# Patient Record
Sex: Female | Born: 1970 | Race: White | Hispanic: No | Marital: Married | Smoking: Never smoker
Health system: Southern US, Community
[De-identification: ages and names within clinical notes are randomized; demographics above are authoritative.]

## PROBLEM LIST (undated history)

## (undated) DIAGNOSIS — F329 Major depressive disorder, single episode, unspecified: Secondary | ICD-10-CM

## (undated) DIAGNOSIS — F419 Anxiety disorder, unspecified: Secondary | ICD-10-CM

## (undated) DIAGNOSIS — F0390 Unspecified dementia without behavioral disturbance: Secondary | ICD-10-CM

## (undated) DIAGNOSIS — E079 Disorder of thyroid, unspecified: Secondary | ICD-10-CM

## (undated) DIAGNOSIS — F319 Bipolar disorder, unspecified: Secondary | ICD-10-CM

## (undated) DIAGNOSIS — E039 Hypothyroidism, unspecified: Secondary | ICD-10-CM

## (undated) HISTORY — PX: WISDOM TOOTH EXTRACTION: SHX21

## (undated) HISTORY — PX: FOOT SURGERY: SHX648

## (undated) HISTORY — PX: CHOLECYSTECTOMY: SHX55

---

## 2015-12-25 ENCOUNTER — Emergency Department (HOSPITAL_COMMUNITY): Payer: BLUE CROSS/BLUE SHIELD

## 2015-12-25 ENCOUNTER — Encounter (HOSPITAL_COMMUNITY): Payer: Self-pay

## 2015-12-25 ENCOUNTER — Emergency Department (HOSPITAL_COMMUNITY)
Admission: EM | Admit: 2015-12-25 | Discharge: 2015-12-25 | Disposition: A | Discharge status: Home / Self Care | Payer: BLUE CROSS/BLUE SHIELD | Attending: Emergency Medicine | Admitting: Emergency Medicine

## 2015-12-25 DIAGNOSIS — K2970 Gastritis, unspecified, without bleeding: Secondary | ICD-10-CM | POA: Insufficient documentation

## 2015-12-25 DIAGNOSIS — R109 Unspecified abdominal pain: Secondary | ICD-10-CM | POA: Diagnosis present

## 2015-12-25 DIAGNOSIS — Z9049 Acquired absence of other specified parts of digestive tract: Secondary | ICD-10-CM | POA: Insufficient documentation

## 2015-12-25 HISTORY — DX: Disorder of thyroid, unspecified: E07.9

## 2015-12-25 LAB — URINALYSIS, ROUTINE W REFLEX MICROSCOPIC
Bilirubin Urine: NEGATIVE
GLUCOSE, UA: NEGATIVE mg/dL
Hgb urine dipstick: NEGATIVE
Ketones, ur: NEGATIVE mg/dL
NITRITE: NEGATIVE
PROTEIN: NEGATIVE mg/dL
Specific Gravity, Urine: 1.023 (ref 1.005–1.030)
pH: 7.5 (ref 5.0–8.0)

## 2015-12-25 LAB — COMPREHENSIVE METABOLIC PANEL
ALBUMIN: 3.6 g/dL (ref 3.5–5.0)
ALT: 11 U/L — ABNORMAL LOW (ref 14–54)
AST: 16 U/L (ref 15–41)
Alkaline Phosphatase: 62 U/L (ref 38–126)
Anion gap: 7 (ref 5–15)
BUN: 14 mg/dL (ref 6–20)
CO2: 26 mmol/L (ref 22–32)
CREATININE: 1.14 mg/dL — AB (ref 0.44–1.00)
Calcium: 9.5 mg/dL (ref 8.9–10.3)
Chloride: 104 mmol/L (ref 101–111)
GFR, EST NON AFRICAN AMERICAN: 57 mL/min — AB (ref >60)
Glucose, Bld: 100 mg/dL — ABNORMAL HIGH (ref 65–99)
POTASSIUM: 4.2 mmol/L (ref 3.5–5.1)
SODIUM: 137 mmol/L (ref 135–145)
TOTAL PROTEIN: 7 g/dL (ref 6.5–8.1)
Total Bilirubin: 0.5 mg/dL (ref 0.3–1.2)

## 2015-12-25 LAB — I-STAT BETA HCG BLOOD, ED (MC, WL, AP ONLY)

## 2015-12-25 LAB — CBC
HCT: 42.4 % (ref 36.0–46.0)
Hemoglobin: 14.1 g/dL (ref 12.0–15.0)
MCH: 31 pg (ref 26.0–34.0)
MCHC: 33.3 g/dL (ref 30.0–36.0)
MCV: 93.2 fL (ref 78.0–100.0)
PLATELETS: 360 10*3/uL (ref 150–400)
RBC: 4.55 MIL/uL (ref 3.87–5.11)
RDW: 11.9 % (ref 11.5–15.5)
WBC: 7.2 10*3/uL (ref 4.0–10.5)

## 2015-12-25 LAB — URINE MICROSCOPIC-ADD ON: RBC / HPF: NONE SEEN RBC/hpf (ref 0–5)

## 2015-12-25 LAB — LIPASE, BLOOD: LIPASE: 19 U/L (ref 11–51)

## 2015-12-25 MED ORDER — PANTOPRAZOLE SODIUM 40 MG IV SOLR
40.0000 mg | Freq: Once | INTRAVENOUS | Status: AC
  Administered 2015-12-25: 40 mg via INTRAVENOUS
  Filled 2015-12-25: qty 40

## 2015-12-25 MED ORDER — PANTOPRAZOLE SODIUM 40 MG PO TBEC
40.0000 mg | DELAYED_RELEASE_TABLET | Freq: Once | ORAL | Status: AC
  Administered 2015-12-25: 40 mg via ORAL
  Filled 2015-12-25: qty 1

## 2015-12-25 MED ORDER — MORPHINE SULFATE (PF) 4 MG/ML IV SOLN
4.0000 mg | Freq: Once | INTRAVENOUS | Status: AC
  Administered 2015-12-25: 4 mg via INTRAVENOUS
  Filled 2015-12-25: qty 1

## 2015-12-25 MED ORDER — GI COCKTAIL ~~LOC~~
30.0000 mL | Freq: Once | ORAL | Status: AC
  Administered 2015-12-25: 30 mL via ORAL
  Filled 2015-12-25: qty 30

## 2015-12-25 MED ORDER — ONDANSETRON HCL 4 MG/2ML IJ SOLN
4.0000 mg | Freq: Once | INTRAMUSCULAR | Status: AC
  Administered 2015-12-25: 4 mg via INTRAVENOUS
  Filled 2015-12-25: qty 2

## 2015-12-25 MED ORDER — IOPAMIDOL (ISOVUE-300) INJECTION 61%
INTRAVENOUS | Status: AC
  Administered 2015-12-25: 100 mL
  Filled 2015-12-25: qty 100

## 2015-12-25 MED ORDER — SUCRALFATE 1 G PO TABS: 1.0000 g | ORAL_TABLET | Freq: Three times a day (TID) | ORAL | Status: DC

## 2015-12-25 MED ORDER — ESOMEPRAZOLE MAGNESIUM 40 MG PO CPDR: 40.0000 mg | DELAYED_RELEASE_CAPSULE | Freq: Every day | ORAL | Status: DC

## 2015-12-25 MED ORDER — SODIUM CHLORIDE 0.9 % IV BOLUS (SEPSIS)
1000.0000 mL | Freq: Once | INTRAVENOUS | Status: AC
  Administered 2015-12-25: 1000 mL via INTRAVENOUS

## 2015-12-25 MED ORDER — HYDROMORPHONE HCL 1 MG/ML IJ SOLN
1.0000 mg | Freq: Once | INTRAMUSCULAR | Status: AC
  Administered 2015-12-25: 1 mg via INTRAVENOUS
  Filled 2015-12-25: qty 1

## 2015-12-25 MED ORDER — PROMETHAZINE HCL 25 MG PO TABS: 25.0000 mg | ORAL_TABLET | Freq: Three times a day (TID) | ORAL | Status: DC | PRN

## 2015-12-25 NOTE — ED Provider Notes (Signed)
CSN: [id number]     Arrival date & time 12/25/15  1114 History  By signing my name below, I, Iona Beard, attest that this documentation has been prepared under the direction and in the presence of [employer], PA-C.   Electronically Signed: Iona Beard, ED Scribe 12/25/2015 at 1:08 PM.   Chief Complaint  Patient presents with  . Abdominal Pain    The history is provided by the patient. No language interpreter was used.   HPI Comments: Kalkidan Caudell is a 45 y.o. female with PSHx of cholecystectomy who presents to the Emergency Department complaining of gradual onset, intermittent, abdominal pain, ongoing for several months, worsening yesterday. Pt reports associated nausea, weakness, and melena. She notes her bowel movements have a strong, "metallic" smell. No other associated symptoms noted. Pt has taken antacid medication prescribed by her PCP with no relief to symptoms. No other worsening or alleviating factors noted. Pt denies emesis, fever, chills, or any other pertinent symptoms. Pt was supposed to have a follow up with a GI MD but has not had an appointment made yet.   Past Medical History  Diagnosis Date  . Thyroid disease    History reviewed. No pertinent past surgical history. No family history on file. Social History  Substance Use Topics  . Smoking status: Never Smoker   . Smokeless tobacco: None  . Alcohol Use: None   OB History    No data available     Review of Systems A complete 10 system review of systems was obtained and all systems are negative except as noted in the HPI and PMH.    Allergies  Codeine  Home Medications   Prior to Admission medications   Not on File   BP 107/53 mmHg  Pulse 86  Temp(Src) 97.9 F (36.6 C) (Oral)  Resp 17  Ht  (1.626 m)  Wt 153 lb (69.4 kg)  BMI 26.25 kg/m2  SpO2 99%  LMP 12/18/2015 Physical Exam  Constitutional: She is oriented to person, place, and time. She appears well-developed and  well-nourished. No distress.  HENT:  Head: Normocephalic and atraumatic.  Mouth/Throat: Oropharynx is clear and moist.  Eyes: EOM are normal. Pupils are equal, round, and reactive to light.  Neck: Normal range of motion. Neck supple.  Cardiovascular: Normal rate, regular rhythm and normal heart sounds.  Exam reveals no gallop and no friction rub.   No murmur heard. Pulmonary/Chest: Effort normal and breath sounds normal. No respiratory distress. She has no wheezes.  Abdominal: Soft. Bowel sounds are normal. She exhibits no distension. There is tenderness. There is no rebound and no guarding.  Diffuse abdominal TTP, worse in upper abdomen.   Musculoskeletal: Normal range of motion.  Neurological: She is alert and oriented to person, place, and time. She exhibits normal muscle tone. Coordination normal.  Skin: Skin is warm and dry.  Psychiatric: She has a normal mood and affect. Judgment normal.  Nursing note and vitals reviewed.   ED Course  Procedures (including critical care time) DIAGNOSTIC STUDIES: Oxygen Saturation is 99% on RA, normal by my interpretation.    COORDINATION OF CARE: 1:08 PM Discussed treatment plan with pt at bedside and pt agreed to plan.   Labs Review Labs Reviewed  COMPREHENSIVE METABOLIC PANEL - Abnormal; Notable for the following:    Glucose, Bld 100 (*)    Creatinine, Ser 1.14 (*)    ALT 11 (*)    GFR calc non Af Amer 57 (*)    All  other components within normal limits  URINALYSIS, ROUTINE W REFLEX MICROSCOPIC (NOT AT [facility]) - Abnormal; Notable for the following:    Leukocytes, UA SMALL (*)    All other components within normal limits  URINE MICROSCOPIC-ADD ON - Abnormal; Notable for the following:    Squamous Epithelial / LPF 0-5 (*)    Bacteria, UA FEW (*)    All other components within normal limits  LIPASE, BLOOD  CBC  I-STAT BETA HCG BLOOD, ED ([facility], [facility], [facility] ONLY)    Imaging Review No results found. I have personally reviewed and evaluated  these images and lab results as part of my medical decision-making.   EKG Interpretation None      I explained to the patient in great detail that she needs GI follow-up for this issue.  Advised this could be an ulceration that is caused by bacteria or just erosion of the lining of her stomach acid production.  Patient November many hours.  She is also advised that she will need to return here for any worsening in her condition.  The CT scan did not show any significant abnormality.  The patient has been dealing with this issue for several months.  This is not causing any significant blood loss this point, she will be discharged home   Charlestine NightChristopher Aidden Markovic, PA-C 12/29/15 [id number]  Laurence Spatesachel Morgan Little, MD 12/30/15 [phone]

## 2015-12-25 NOTE — ED Notes (Signed)
Patient here with several months of sharp intermittent abdominal pain that is followed by weakness and dark stools. Was to have f/u with GI MD and has not had appointment. . Nausea without vomiting.  Reports stool has strong odor

## 2015-12-25 NOTE — ED Notes (Signed)
Pt reporting pain when moving, discussed plan of care with CLawyer PA-C. Will D/C on liquid diet.

## 2015-12-25 NOTE — Discharge Instructions (Signed)
REturn here as needed. Follow up with the GI Doctor provided.     Gastritis, Adult Gastritis is soreness and puffiness (inflammation) of the lining of the stomach. If you do not get help, gastritis can cause bleeding and sores (ulcers) in the stomach. HOME CARE   Only take medicine as told by your doctor.  If you were given antibiotic medicines, take them as told. Finish the medicines even if you start to feel better.  Drink enough fluids to keep your pee (urine) clear or pale yellow.  Avoid foods and drinks that make your problems worse. Foods you may want to avoid include:  Caffeine or alcohol.  Chocolate.  Mint.  Garlic and onions.  Spicy foods.  Citrus fruits, including oranges, lemons, or limes.  Food containing tomatoes, including sauce, chili, salsa, and pizza.  Fried and fatty foods.  Eat small meals throughout the day instead of large meals. GET HELP RIGHT AWAY IF:   You have black or dark red poop (stools).  You throw up (vomit) blood. It may look like coffee grounds.  You cannot keep fluids down.  Your belly (abdominal) pain gets worse.  You have a fever.  You do not feel better after 1 week.  You have any other questions or concerns. MAKE SURE YOU:   Understand these instructions.  Will watch your condition.  Will get help right away if you are not doing well or get worse.   This information is not intended to replace advice given to you by your health care provider. Make sure you discuss any questions you have with your health care provider.   Document Released: 12/30/2007 Document Revised: 10/05/2011 Document Reviewed: 08/26/2011 Elsevier Interactive Patient Education [employer]2016 Elsevier Inc.

## 2015-12-25 NOTE — ED Notes (Signed)
Patient left at this time with all belongings. 

## 2015-12-31 ENCOUNTER — Encounter (INDEPENDENT_AMBULATORY_CARE_PROVIDER_SITE_OTHER): Payer: Self-pay | Admitting: Internal Medicine

## 2015-12-31 ENCOUNTER — Other Ambulatory Visit (INDEPENDENT_AMBULATORY_CARE_PROVIDER_SITE_OTHER): Payer: Self-pay | Admitting: Internal Medicine

## 2015-12-31 ENCOUNTER — Encounter (INDEPENDENT_AMBULATORY_CARE_PROVIDER_SITE_OTHER): Payer: Self-pay | Admitting: *Deleted

## 2015-12-31 ENCOUNTER — Ambulatory Visit (INDEPENDENT_AMBULATORY_CARE_PROVIDER_SITE_OTHER): Payer: BLUE CROSS/BLUE SHIELD | Admitting: Internal Medicine

## 2015-12-31 VITALS — BP 100/50 | HR 72 | Temp 98.3°F | Ht 64.0 in | Wt 155.2 lb

## 2015-12-31 DIAGNOSIS — R1013 Epigastric pain: Secondary | ICD-10-CM

## 2015-12-31 DIAGNOSIS — K921 Melena: Secondary | ICD-10-CM

## 2015-12-31 DIAGNOSIS — K219 Gastro-esophageal reflux disease without esophagitis: Secondary | ICD-10-CM

## 2015-12-31 NOTE — Patient Instructions (Signed)
EGD. The risks and benefits such as perforation, bleeding, and infection were reviewed with the patient and is agreeable. 

## 2015-12-31 NOTE — Progress Notes (Signed)
Subjective:    Patient ID: Kaylee Boyer, female    DOB: 09/01/1970, 45 y.o.   MRN: [id number]  HPIReferred by Dr. Margo Commonapper for intermittent  epigastric pain, nausea, weakness and melena. Seen in ED 12/25/2015 and advised to f/u with GI. She says for months that she had bloating. She says her bra bothered her epigastric region. She noticed about 2 months ago her stools were black and smelled like metal. The black stools last for 2 months.  Last week, she saw dark stools and had a medal smell. Her stool was black last night.  She denies taking any NSAIDS. Denies prior hx of melena. Appetite is okay. No weight loss. Has taken Pepto Bismol but her stools were black before that.     CBC    Component Value Date/Time   WBC 7.2 12/25/2015 1126   RBC 4.55 12/25/2015 1126   HGB 14.1 12/25/2015 1126   HCT 42.4 12/25/2015 1126   PLT 360 12/25/2015 1126   MCV 93.2 12/25/2015 1126   MCH 31.0 12/25/2015 1126   MCHC 33.3 12/25/2015 1126   RDW 11.9 12/25/2015 1126       The history is provided by the patient. No language interpreter was used.   HPI Comments: Kaylee Boyer is a 45 y.o. female with PSHx of cholecystectomy who presents to the Emergency Department complaining of gradual onset, intermittent, abdominal pain, ongoing for several months, worsening yesterday. Pt reports associated nausea, weakness, and melena. She notes her bowel movements have a strong, "metallic" smell. No other associated symptoms noted. Pt has taken antacid medication prescribed by her PCP with no relief to symptoms. No other worsening or alleviating factors noted. Pt denies emesis, fever, chills, or any other pertinent symptoms. Pt was supposed to have a follow up with a GI MD but has not had an appointment made yet.   Review of Systems Past Medical History  Diagnosis Date  . Thyroid disease     Past Surgical History  Procedure Laterality Date  . Cholecystectomy       6 yrs ago    Allergies    Allergen Reactions  . Codeine Rash    Current Outpatient Prescriptions on File Prior to Visit  Medication Sig Dispense Refill  . esomeprazole (NEXIUM) 40 MG capsule Take 1 capsule (40 mg total) by mouth daily. 30 capsule 0  . FLUoxetine (PROZAC) 20 MG capsule Take 40 mg by mouth daily.  4  . GIANVI 3-0.02 MG tablet Take 1 tablet by mouth daily.  0  . promethazine (PHENERGAN) 25 MG tablet Take 1 tablet (25 mg total) by mouth every 8 (eight) hours as needed for nausea or vomiting. 15 tablet 0  . sucralfate (CARAFATE) 1 g tablet Take 1 tablet (1 g total) by mouth 4 (four) times daily -  with meals and at bedtime. 30 tablet 0  . SYNTHROID 125 MCG tablet Take 125 mcg by mouth daily.  11  . traZODone (DESYREL) 50 MG tablet Take 50-100 mg by mouth at bedtime.   2   No current facility-administered medications on file prior to visit.        Objective:   Physical Exam Blood pressure 100/50, pulse 72, temperature 98.3 F (36.8 C), height 5\' 4"  (1.626 m), weight 155 lb 3.2 oz (70.398 kg), last menstrual period 12/18/2015. Alert and oriented. Skin warm and dry. Oral mucosa is moist.   . Sclera anicteric, conjunctivae is pink. Thyroid not enlarged. No cervical lymphadenopathy. Lungs clear. Heart regular rate  and rhythm.  Abdomen is soft. Bowel sounds are positive. No hepatomegaly. No abdominal masses felt. No tenderness.  No edema to lower extremities.   Very little stool: guaiac negative.   Lot [id number] Ex 9/17     Assessment & Plan:  Melena, PUD needs to be ruled out.  EGD. The risks and benefits such as perforation, bleeding, and infection were reviewed with the patient and is agreeable. EGD. The risks and benefits such as perforation, bleeding, and infection were reviewed with the patient and is agreeable.

## 2016-01-16 ENCOUNTER — Encounter (HOSPITAL_COMMUNITY)
Admission: RE | Disposition: A | Discharge status: Home / Self Care | Payer: Self-pay | Source: Ambulatory Visit | Attending: Internal Medicine

## 2016-01-16 ENCOUNTER — Other Ambulatory Visit (INDEPENDENT_AMBULATORY_CARE_PROVIDER_SITE_OTHER): Payer: Self-pay | Admitting: *Deleted

## 2016-01-16 ENCOUNTER — Encounter (HOSPITAL_COMMUNITY): Payer: Self-pay | Admitting: *Deleted

## 2016-01-16 ENCOUNTER — Ambulatory Visit (HOSPITAL_COMMUNITY)
Admission: RE | Admit: 2016-01-16 | Discharge: 2016-01-16 | Disposition: A | Discharge status: Home / Self Care | Payer: BLUE CROSS/BLUE SHIELD | Source: Ambulatory Visit | Attending: Internal Medicine | Admitting: Internal Medicine

## 2016-01-16 DIAGNOSIS — Z79899 Other long term (current) drug therapy: Secondary | ICD-10-CM | POA: Insufficient documentation

## 2016-01-16 DIAGNOSIS — R1013 Epigastric pain: Secondary | ICD-10-CM

## 2016-01-16 DIAGNOSIS — K921 Melena: Secondary | ICD-10-CM | POA: Diagnosis not present

## 2016-01-16 DIAGNOSIS — E039 Hypothyroidism, unspecified: Secondary | ICD-10-CM | POA: Insufficient documentation

## 2016-01-16 DIAGNOSIS — K2950 Unspecified chronic gastritis without bleeding: Secondary | ICD-10-CM | POA: Insufficient documentation

## 2016-01-16 HISTORY — PX: ESOPHAGOGASTRODUODENOSCOPY: SHX5428

## 2016-01-16 HISTORY — DX: Hypothyroidism, unspecified: E03.9

## 2016-01-16 LAB — CBC WITH DIFFERENTIAL/PLATELET
Basophils Absolute: 0 10*3/uL (ref 0.0–0.1)
Basophils Relative: 0 %
EOS PCT: 1 %
Eosinophils Absolute: 0 10*3/uL (ref 0.0–0.7)
HCT: 36.4 % (ref 36.0–46.0)
Hemoglobin: 12.2 g/dL (ref 12.0–15.0)
LYMPHS ABS: 1.5 10*3/uL (ref 0.7–4.0)
LYMPHS PCT: 30 %
MCH: 31.5 pg (ref 26.0–34.0)
MCHC: 33.5 g/dL (ref 30.0–36.0)
MCV: 94.1 fL (ref 78.0–100.0)
Monocytes Absolute: 0.2 10*3/uL (ref 0.1–1.0)
Monocytes Relative: 5 %
Neutro Abs: 3.2 10*3/uL (ref 1.7–7.7)
Neutrophils Relative %: 64 %
PLATELETS: 279 10*3/uL (ref 150–400)
RBC: 3.87 MIL/uL (ref 3.87–5.11)
RDW: 12.3 % (ref 11.5–15.5)
WBC: 5 10*3/uL (ref 4.0–10.5)

## 2016-01-16 LAB — HEPATIC FUNCTION PANEL
ALBUMIN: 3.1 g/dL — AB (ref 3.5–5.0)
ALK PHOS: 51 U/L (ref 38–126)
ALT: 7 U/L — AB (ref 14–54)
AST: 11 U/L — ABNORMAL LOW (ref 15–41)
Bilirubin, Direct: 0.1 mg/dL (ref 0.1–0.5)
Indirect Bilirubin: 0.3 mg/dL (ref 0.3–0.9)
TOTAL PROTEIN: 6.2 g/dL — AB (ref 6.5–8.1)
Total Bilirubin: 0.4 mg/dL (ref 0.3–1.2)

## 2016-01-16 SURGERY — EGD (ESOPHAGOGASTRODUODENOSCOPY)
Anesthesia: Moderate Sedation

## 2016-01-16 MED ORDER — SODIUM CHLORIDE 0.9 % IV SOLN
INTRAVENOUS | Status: DC
  Administered 2016-01-16: 1000 mL via INTRAVENOUS

## 2016-01-16 MED ORDER — MEPERIDINE HCL 50 MG/ML IJ SOLN
INTRAMUSCULAR | Status: DC | PRN
  Administered 2016-01-16 (×2): 20 mg via INTRAVENOUS

## 2016-01-16 MED ORDER — MIDAZOLAM HCL 5 MG/5ML IJ SOLN
INTRAMUSCULAR | Status: DC | PRN
  Administered 2016-01-16 (×4): 2 mg via INTRAVENOUS

## 2016-01-16 MED ORDER — HYDROCODONE-ACETAMINOPHEN 5-325 MG PO TABS
1.0000 | ORAL_TABLET | Freq: Four times a day (QID) | ORAL | Status: DC | PRN
Start: 2016-01-16 — End: 2018-08-29

## 2016-01-16 MED ORDER — DICYCLOMINE HCL 10 MG PO CAPS: 10.0000 mg | ORAL_CAPSULE | Freq: Three times a day (TID) | ORAL | Status: DC

## 2016-01-16 MED ORDER — MEPERIDINE HCL 50 MG/ML IJ SOLN
INTRAMUSCULAR | Status: AC
  Filled 2016-01-16: qty 1

## 2016-01-16 MED ORDER — BUTAMBEN-TETRACAINE-BENZOCAINE 2-2-14 % EX AERO
INHALATION_SPRAY | CUTANEOUS | Status: DC | PRN
  Administered 2016-01-16: 2 via TOPICAL

## 2016-01-16 MED ORDER — MIDAZOLAM HCL 5 MG/5ML IJ SOLN
INTRAMUSCULAR | Status: AC
  Filled 2016-01-16: qty 10

## 2016-01-16 NOTE — Discharge Instructions (Signed)
Resume usual medications and diet. Dicyclomine 10 mg by mouth 30 minutes before each meal daily. CBC with differential and LFTs to be checked today. No driving for 24 hours. Physician will call with results of biopsy and blood tests. Hemoccult 1 when stool turns black again. Gastrointestinal Endoscopy, Care After Refer to this sheet in the next few weeks. These instructions provide you with information on caring for yourself after your procedure. Your caregiver may also give you more specific instructions. Your treatment has been planned according to current medical practices, but problems sometimes occur. Call your caregiver if you have any problems or questions after your procedure. HOME CARE INSTRUCTIONS  If you were given medicine to help you relax (sedative), do not drive, operate machinery, or sign important documents for 24 hours.  Avoid alcohol and hot or warm beverages for the first 24 hours after the procedure.  Only take over-the-counter or prescription medicines for pain, discomfort, or fever as directed by your caregiver. You may resume taking your normal medicines unless your caregiver tells you otherwise. Ask your caregiver when you may resume taking medicines that may cause bleeding, such as aspirin, clopidogrel, or warfarin.  You may return to your normal diet and activities on the day after your procedure, or as directed by your caregiver. Walking may help to reduce any bloated feeling in your abdomen.  Drink enough fluids to keep your urine clear or pale yellow.  You may gargle with salt water if you have a sore throat. SEEK IMMEDIATE MEDICAL CARE IF:  You have severe nausea or vomiting.  You have severe abdominal pain, abdominal cramps that last longer than 6 hours, or abdominal swelling (distention).  You have severe shoulder or back pain.  You have trouble swallowing.  You have shortness of breath, your breathing is shallow, or you are breathing faster than  normal.  You have a fever or a rapid heartbeat.  You vomit blood or material that looks like coffee grounds.  You have bloody, black, or tarry stools. MAKE SURE YOU:  Understand these instructions.  Will watch your condition.  Will get help right away if you are not doing well or get worse.   This information is not intended to replace advice given to you by your health care provider. Make sure you discuss any questions you have with your health care provider.   Document Released: 02/25/2004 Document Revised: 08/03/2014 Document Reviewed: 10/13/2011 Elsevier Interactive Patient Education [employer]2016 Elsevier Inc.

## 2016-01-16 NOTE — Telephone Encounter (Signed)
Patient presented to OV to pick up a Hemoccult Card. This is post procedure this morning. She ask to be sure that Dicycl[facility] had been [facility] and also states that she is in pain. Points to Epigastric region. Dr.Rehman call and he ask that Terri sign off on this for him.

## 2016-01-16 NOTE — H&P (Signed)
Kaylee Boyer is an 45 y.o. female.   Chief Complaint: Patient is here for diagnostic EGD. HPI: This [age] old Caucasian female who presents with three-month history of intermittent epigastric pain associated with nausea. These episodes are not necessarily associate associated with meals. She had one episode on 12/25/2015 when she was seen in emergency room at [facility]. Lab studies were negative including CBC comprehensive chemistry panel and serum lipase. She also abdominopelvic CT was unremarkable. He does give history of tarry stools occurring before she took Pepto-Bismol. She does not have good appetite. She has lost 20 pounds since August last year weight loss is felt to be voluntary. She does not take OTC NSAIDs. She has not gotten relief with PPI. She is status post cholecystectomy about 6 years ago. He describes this pain to be much worse than the pain she had prior to cholecystectomy. She does not drink alcohol or smoke cigarettes.  Past Medical History  Diagnosis Date  . Thyroid disease   . Hypothyroidism     Past Surgical History  Procedure Laterality Date  . Cholecystectomy       6 yrs ago    History reviewed. No pertinent family history. Social History:  reports that she has never smoked. She does not have any smokeless tobacco history on file. She reports that she does not drink alcohol or use illicit drugs.  Allergies:  Allergies  Allergen Reactions  . Codeine Rash    Medications Prior to Admission  Medication Sig Dispense Refill  . esomeprazole (NEXIUM) 40 MG capsule Take 1 capsule (40 mg total) by mouth daily. 30 capsule 0  . FLUoxetine (PROZAC) 20 MG capsule Take 40 mg by mouth daily.  4  . GIANVI 3-0.02 MG tablet Take 1 tablet by mouth daily.  0  . MAGNESIUM PO Take 2 tablets by mouth daily.    . promethazine (PHENERGAN) 25 MG tablet Take 1 tablet (25 mg total) by mouth every 8 (eight) hours as needed for nausea or vomiting. 15 tablet 0  . SYNTHROID 125 MCG tablet  Take 125 mcg by mouth daily.  11  . traZODone (DESYREL) 50 MG tablet Take 50-100 mg by mouth at bedtime.   2  . sucralfate (CARAFATE) 1 g tablet Take 1 tablet (1 g total) by mouth 4 (four) times daily -  with meals and at bedtime. (Patient not taking: Reported on 01/09/2016) 30 tablet 0    No results found for this or any previous visit (from the past 48 hour(s)). No results found.  ROS  Blood pressure 106/61, pulse 66, temperature 98.1 F (36.7 C), temperature source Oral, resp. rate 15, height 5\' 4"  (1.626 m), weight 152 lb (68.947 kg), last menstrual period 12/18/2015, SpO2 100 %. Physical Exam  Constitutional: She appears well-developed and well-nourished.  HENT:  Mouth/Throat: Oropharynx is clear and moist.  Eyes: Conjunctivae are normal. No scleral icterus.  Neck: No thyromegaly present.  Cardiovascular: Normal rate, regular rhythm and normal heart sounds.   No murmur heard. Respiratory: Effort normal and breath sounds normal.  GI:  Abdomen is symmetrical and soft on palpation with mild midepigastric tenderness. No organomegaly or masses.  Musculoskeletal: She exhibits no edema.  Lymphadenopathy:    She has no cervical adenopathy.  Neurological: She is alert.  Skin: Skin is warm and dry.     Assessment/Plan Recurrent epigastric pain and nausea unresponsive to PPI. Diagnostic EGD.  Lionel DecemberNajeeb Rehman, MD 01/16/2016, 10:40 AM

## 2016-01-16 NOTE — Op Note (Addendum)
[facility] Patient Name: Kaylee Boyer Procedure Date: 01/16/2016 10:31 AM MRN: [id number] Date of Birth: [birth year] Attending MD: Lionel December , MD CSN: [id number] Age: 45 Admit Type: Outpatient Procedure:                Upper GI endoscopy Indications:              Epigastric abdominal pain Providers:                Lionel December, MD, Jannett Celestine, RN, Calton Dach,                            Technician Referring MD:             Oley Balm. Margo Common, MD Medicines:                Cetacaine spray, Meperidine 40 mg IV, Midazolam 8                            mg IV Complications:            No immediate complications. Estimated Blood Loss:     Estimated blood loss was minimal. Procedure:                Pre-Anesthesia Assessment:                           - Prior to the procedure, a History and Physical                            was performed, and patient medications and                            allergies were reviewed. The patient's tolerance of                            previous anesthesia was also reviewed. The risks                            and benefits of the procedure and the sedation                            options and risks were discussed with the patient.                            All questions were answered, and informed consent                            was obtained. Prior Anticoagulants: The patient has                            taken no previous anticoagulant or antiplatelet                            agents. ASA Grade Assessment: II - A patient with  mild systemic disease. After reviewing the risks                            and benefits, the patient was deemed in                            satisfactory condition to undergo the procedure.                           After obtaining informed consent, the endoscope was                            passed under direct vision. Throughout the                            procedure, the  patient's blood pressure, pulse, and                            oxygen saturations were monitored continuously. The                            [id number] ([id number]) scope was introduced through the                            mouth, and advanced to the second part of duodenum.                            The upper GI endoscopy was accomplished without                            difficulty. The patient tolerated the procedure                            well. Scope In: 10:55:34 AM Scope Out: 11:04:07 AM Total Procedure Duration: 0 hours 8 minutes 33 seconds  Findings:      The examined esophagus was normal.      The Z-line was regular and was found 37 cm from the incisors.      The entire examined stomach was normal. Biopsies were taken with a cold       forceps for histology.      The duodenal bulb and second portion of the duodenum were normal.       Biopsies were taken with a cold forceps for histology. Impression:               - Normal esophagus.                           - Z-line regular, 37 cm from the incisors.                           - Normal stomach. Biopsied.                           - Normal duodenal bulb and second portion of the  duodenum. Biopsied. Moderate Sedation:      Moderate (conscious) sedation was administered by the endoscopy nurse       and supervised by the endoscopist. The following parameters were       monitored: oxygen saturation, heart rate, blood pressure, CO2       capnography and response to care. Total physician intraservice time was       19 minutes. Recommendation:           - Patient has a contact number available for                            emergencies. The signs and symptoms of potential                            delayed complications were discussed with the                            patient. Return to normal activities tomorrow.                            Written discharge instructions were provided to the                             patient.                           - Resume previous diet today.                           - Continue present medications.                           - Await pathology results.                           - Check liver enzymes (AST, ALT, alkaline                            phosphatase, bilirubin) today.                           - Check CBC with differential today.                           - Use Bentyl (dicyclomine) 10 mg PO TID 30 min AC. Procedure Code(s):        --- Professional ---                           [phone], Esophagogastroduodenoscopy, flexible,                            transoral; with biopsy, single or multiple                           99152, Moderate sedation services provided by the  same physician or other qualified health care                            professional performing the diagnostic or                            therapeutic service that the sedation supports,                            requiring the presence of an independent trained                            observer to assist in the monitoring of the                            patient's level of consciousness and physiological                            status; initial 15 minutes of intraservice time,                            patient age 59 years or older Diagnosis Code(s):        --- Professional ---                           R10.13, Epigastric pain CPT copyright 2016 American Medical Association. All rights reserved. The codes documented in this report are preliminary and upon coder review may  be revised to meet current compliance requirements. Lionel December, MD Lionel December, MD 01/16/2016 11:14:48 AM This report has been signed electronically. Number of Addenda: 0

## 2016-01-20 ENCOUNTER — Other Ambulatory Visit (INDEPENDENT_AMBULATORY_CARE_PROVIDER_SITE_OTHER): Payer: Self-pay | Admitting: Internal Medicine

## 2016-01-20 DIAGNOSIS — R110 Nausea: Secondary | ICD-10-CM

## 2016-01-20 DIAGNOSIS — R109 Unspecified abdominal pain: Secondary | ICD-10-CM

## 2016-01-22 ENCOUNTER — Encounter (HOSPITAL_COMMUNITY): Payer: Self-pay | Admitting: Internal Medicine

## 2016-01-24 ENCOUNTER — Ambulatory Visit (HOSPITAL_COMMUNITY)
Admission: RE | Admit: 2016-01-24 | Discharge: 2016-01-24 | Disposition: A | Discharge status: Home / Self Care | Payer: BLUE CROSS/BLUE SHIELD | Source: Ambulatory Visit | Attending: Internal Medicine | Admitting: Internal Medicine

## 2016-01-24 DIAGNOSIS — R110 Nausea: Secondary | ICD-10-CM | POA: Diagnosis not present

## 2016-01-24 DIAGNOSIS — R109 Unspecified abdominal pain: Secondary | ICD-10-CM | POA: Insufficient documentation

## 2016-01-29 ENCOUNTER — Encounter (INDEPENDENT_AMBULATORY_CARE_PROVIDER_SITE_OTHER): Payer: Self-pay | Admitting: Internal Medicine

## 2016-01-29 NOTE — Progress Notes (Signed)
Patient was given an appointment for 02/25/16 at 10:45am with Dorene Arerri Setzer.

## 2016-02-04 ENCOUNTER — Telehealth (INDEPENDENT_AMBULATORY_CARE_PROVIDER_SITE_OTHER): Payer: Self-pay | Admitting: Internal Medicine

## 2016-02-04 ENCOUNTER — Other Ambulatory Visit (INDEPENDENT_AMBULATORY_CARE_PROVIDER_SITE_OTHER): Payer: Self-pay | Admitting: Internal Medicine

## 2016-02-04 MED ORDER — ESOMEPRAZOLE MAGNESIUM 40 MG PO CPDR: 40.0000 mg | DELAYED_RELEASE_CAPSULE | Freq: Every day | ORAL | Status: DC

## 2016-02-04 NOTE — Telephone Encounter (Signed)
Patient called, stated that she is out of Nexium and needs a refill, would like called to [facility].  [phone]

## 2016-02-04 NOTE — Telephone Encounter (Signed)
Prescription sent to the pharmacy and patient informed.

## 2016-02-06 ENCOUNTER — Other Ambulatory Visit (INDEPENDENT_AMBULATORY_CARE_PROVIDER_SITE_OTHER): Payer: Self-pay | Admitting: Internal Medicine

## 2016-02-25 ENCOUNTER — Ambulatory Visit (INDEPENDENT_AMBULATORY_CARE_PROVIDER_SITE_OTHER): Payer: BLUE CROSS/BLUE SHIELD | Admitting: Internal Medicine

## 2016-02-25 ENCOUNTER — Encounter (INDEPENDENT_AMBULATORY_CARE_PROVIDER_SITE_OTHER): Payer: Self-pay | Admitting: Internal Medicine

## 2016-02-25 VITALS — BP 102/70 | HR 60 | Temp 99.0°F | Ht 64.0 in | Wt 158.4 lb

## 2016-02-25 DIAGNOSIS — K219 Gastro-esophageal reflux disease without esophagitis: Secondary | ICD-10-CM

## 2016-02-25 NOTE — Patient Instructions (Signed)
OV in 1 year. Continue the Nexium and Dicyclomine

## 2016-02-25 NOTE — Progress Notes (Signed)
Subjective:    Patient ID: Kaylee Boyer, female    DOB: [birth year], 45 y.o.   MRN: [id number] HPI   PCP Dr. Margo Common. Here today for f/u after undergoing EGD in June for epigastric pian and melena.  EGD was essentially normal. She had not been taking any NSAIDS.  She tells me she is doing good. She says the Dicyclomine has helped. She has not had any abdominal pain since June.  Appetite is good. No weight loss. She says she has not bloated. The Nexium has helped.  BM daily. No melena or BRRB      01/16/2016 EGD:  Upper abdominal pain: Dr. Karilyn Cota:  Impression:               - Normal esophagus.                           - Z-line regular, 37 cm from the incisors.                           - Normal stomach. Biopsied.                           - Normal duodenal bulb and second portion of the                            duodenum. Biopsied.  Normal gastric and duodenal biopsies. Will proceed with small bowel follow-through to further evaluate her abdominal pain  Small bowel follow thru is norma..     Review of Systems Past Medical History:  Diagnosis Date  . Hypothyroidism   . Thyroid disease     Past Surgical History:  Procedure Laterality Date  . CHOLECYSTECTOMY      6 yrs ago  . ESOPHAGOGASTRODUODENOSCOPY N/A 01/16/2016   Procedure: ESOPHAGOGASTRODUODENOSCOPY (EGD);  Surgeon: Malissa Hippo, MD;  Location: [facility];  Service: Endoscopy;  Laterality: N/A;  10:30    Allergies  Allergen Reactions  . Codeine Rash    Current Outpatient Prescriptions on File Prior to Visit  Medication Sig Dispense Refill  . dicyclomine (BENTYL) 10 MG capsule Take 1 capsule (10 mg total) by mouth 3 (three) times daily before meals. 90 capsule 2  . esomeprazole (NEXIUM) 40 MG capsule Take 1 capsule (40 mg total) by mouth daily. 30 capsule 5  . FLUoxetine (PROZAC) 20 MG capsule Take 40 mg by mouth daily.  4  . GIANVI 3-0.02 MG tablet Take 1 tablet by mouth daily.  0  .  HYDROcodone-acetaminophen (NORCO/VICODIN) 5-325 MG tablet Take 1 tablet by mouth every 6 (six) hours as needed for moderate pain. 30 tablet 0  . MAGNESIUM PO Take 2 tablets by mouth daily.    . promethazine (PHENERGAN) 25 MG tablet Take 1 tablet (25 mg total) by mouth every 8 (eight) hours as needed for nausea or vomiting. 15 tablet 0  . SYNTHROID 125 MCG tablet Take 125 mcg by mouth daily.  11  . traZODone (DESYREL) 50 MG tablet Take 50-100 mg by mouth at bedtime.   2   No current facility-administered medications on file prior to visit.        Objective:   Physical Exam Blood pressure 102/70, pulse 60, temperature 99 F (37.2 C), height 5\' 4"  (1.626 m), weight 158 lb 6.4 oz (71.8 kg). Alert and oriented.  Skin warm and dry. Oral mucosa is moist.   . Sclera anicteric, conjunctivae is pink. Thyroid not enlarged. No cervical lymphadenopathy. Lungs clear. Heart regular rate and rhythm.  Abdomen is soft. Bowel sounds are positive. No hepatomegaly. No abdominal masses felt. No tenderness.  No edema to lower extremities.          Assessment & Plan:  Epigastric pain.  Next episode will get Hepatic function.  Continue the Nexium and Dicyclomine.

## 2017-01-01 DIAGNOSIS — E559 Vitamin D deficiency, unspecified: Secondary | ICD-10-CM | POA: Insufficient documentation

## 2017-02-22 DIAGNOSIS — L720 Epidermal cyst: Secondary | ICD-10-CM | POA: Insufficient documentation

## 2017-02-26 ENCOUNTER — Encounter (INDEPENDENT_AMBULATORY_CARE_PROVIDER_SITE_OTHER): Payer: Self-pay | Admitting: Internal Medicine

## 2017-03-10 ENCOUNTER — Ambulatory Visit (INDEPENDENT_AMBULATORY_CARE_PROVIDER_SITE_OTHER): Payer: BLUE CROSS/BLUE SHIELD | Admitting: Internal Medicine

## 2017-05-03 DIAGNOSIS — B001 Herpesviral vesicular dermatitis: Secondary | ICD-10-CM | POA: Insufficient documentation

## 2017-08-27 ENCOUNTER — Encounter (HOSPITAL_COMMUNITY): Payer: Self-pay | Admitting: Licensed Clinical Social Worker

## 2017-08-27 ENCOUNTER — Ambulatory Visit (INDEPENDENT_AMBULATORY_CARE_PROVIDER_SITE_OTHER): Payer: Commercial Managed Care - PPO | Admitting: Licensed Clinical Social Worker

## 2017-08-27 ENCOUNTER — Encounter (INDEPENDENT_AMBULATORY_CARE_PROVIDER_SITE_OTHER): Payer: Self-pay

## 2017-08-27 DIAGNOSIS — F331 Major depressive disorder, recurrent, moderate: Secondary | ICD-10-CM | POA: Diagnosis not present

## 2017-08-27 NOTE — Progress Notes (Signed)
Comprehensive Clinical Assessment (CCA) Note  08/27/2017 Kaylee Boyer [id number]  Visit Diagnosis:      ICD-10-CM   1. Major depressive disorder, recurrent episode, moderate with anxious distress (HCC) F33.1       CCA Part One  Part One has been completed on paper by the patient.  (See scanned document in Chart Review)  CCA Part Two A  Intake/Chief Complaint:  CCA Intake With Chief Complaint CCA Part Two Date: 08/27/17 CCA Part Two Time: 0858 Chief Complaint/Presenting Problem: Depression and anxiety(Patient is a 47 year old Caucasian female that presents oriented x5 (person, place, situation, time and object) alert, depressed, neatly dressed, appropriately groomed, average height, average weight, and cooperative) Patients Currently Reported Symptoms/Problems: Mood: dificulty with memory, difficulty with communication, difficulty with sleep (sleeping through the night), intense dreams (woke up trying to punch or yelling), low energy, low motivation, fatigue, appetite flucuates, weight difficulties (due to medication, thyroid), irritability, tearfulness, feelings of hopelessness, feelings of worthlessness, low self esteem,  Anxiety: racing thoughts, stomach pain/aches when under stress, chest tightness, nervous, fearful, gets overwhelmed easily, low libido, Manic: increased energy, increased confidence Collateral Involvement: None Individual's Strengths: committed to work, loyal Individual's Preferences: Doesn't prefer to be around a lot of people, prefers to stay home  Individual's Abilities: used to be organized, used to be prompt Type of Services Patient Feels Are Needed: Therapy, medication management Initial Clinical Notes/Concerns: Symptoms started around age 32 when she felt like her family didn't love her and increased in the last several years, symptoms occur daily, symptoms are severe  Mental Health Symptoms Depression:  Depression: Change in energy/activity, Difficulty  Concentrating, Fatigue, Hopelessness, Increase/decrease in appetite, Irritability, Sleep (too much or little), Tearfulness, Worthlessness  Mania:  Mania: Racing thoughts, Overconfidence, Irritability, Increased Energy, Change in energy/activity  Anxiety:   Anxiety: Difficulty concentrating, Irritability, Fatigue, Restlessness, Sleep, Tension, Worrying  Psychosis:  Psychosis: N/A  Trauma:  Trauma: N/A  Obsessions:  Obsessions: N/A  Compulsions:  Compulsions: N/A  Inattention:  Inattention: Poor follow-through on tasks  Hyperactivity/Impulsivity:  Hyperactivity/Impulsivity: N/A  Oppositional/Defiant Behaviors:  Oppositional/Defiant Behaviors: N/A  Borderline Personality:  Emotional Irregularity: N/A  Other Mood/Personality Symptoms:  Other Mood/Personality Symtpoms: None    Mental Status Exam Appearance and self-care  Stature:  Stature: Average  Weight:  Weight: Average weight  Clothing:  Clothing: Neat/clean  Grooming:  Grooming: Normal  Cosmetic use:  Cosmetic Use: Age appropriate  Posture/gait:  Posture/Gait: Normal  Motor activity:  Motor Activity: Not Remarkable  Sensorium  Attention:  Attention: Normal  Concentration:  Concentration: Normal  Orientation:  Orientation: X5  Recall/memory:  Recall/Memory: Normal  Affect and Mood  Affect:  Affect: Depressed  Mood:  Mood: Depressed  Relating  Eye contact:  Eye Contact: Fleeting  Facial expression:  Facial Expression: Depressed  Attitude toward examiner:  Attitude Toward Examiner: Cooperative  Thought and Language  Speech flow: Speech Flow: Normal  Thought content:  Thought Content: Appropriate to mood and circumstances  Preoccupation:  Preoccupations: (None)  Hallucinations:  Hallucinations: (None)  Organization:   Logical   Company secretary of Knowledge:  Fund of Knowledge: Average  Intelligence:  Intelligence: Average  Abstraction:  Abstraction: Normal  Judgement:  Judgement: Normal  Reality Testing:  Reality  Testing: Adequate  Insight:  Insight: Fair  Decision Making:  Decision Making: Normal  Social Functioning  Social Maturity:  Social Maturity: Isolates  Social Judgement:  Social Judgement: Normal  Stress  Stressors:  Stressors: Illness  Coping Ability:  Coping Ability: Overwhelmed  Skill Deficits:   Feels detached  Supports:   family    Family and Psychosocial History: Family history Marital status: Married Number of Years Married: 24 What types of issues is patient dealing with in the relationship?: Difficulty with showing affection in the past  Additional relationship information: None  Are you sexually active?: Yes What is your sexual orientation?: Heterosexual  Has your sexual activity been affected by drugs, alcohol, medication, or emotional stress?: Emotional stress,  Does patient have children?: Yes How many children?: 1 How is patient's relationship with their children?: Good relationship with son   Childhood History:  Childhood History By whom was/is the patient raised?: Both parents Additional childhood history information: Felt like she wasn't shown affection growing up Description of patient's relationship with caregiver when they were a child: Mother: ok relationship, Father: good relationship Patient's description of current relationship with people who raised him/her: Mother: strained relationship  , Father: deceased  How were you disciplined when you got in trouble as a child/adolescent?: not really disciplined  Does patient have siblings?: Yes Number of Siblings: 1 Description of patient's current relationship with siblings: Strained relationship with brother  Did patient suffer any verbal/emotional/physical/sexual abuse as a child?: No Did patient suffer from severe childhood neglect?: No Has patient ever been sexually abused/assaulted/raped as an adolescent or adult?: No Was the patient ever a victim of a crime or a disaster?: No Witnessed domestic violence?:  No Has patient been effected by domestic violence as an adult?: No  CCA Part Two B  Employment/Work Situation: Employment / Work Psychologist, occupationalituation Employment situation: On disability Why is patient on disability: Mental health How long has patient been on disability: 1 year  Patient's job has been impacted by current illness: No What is the longest time patient has a held a job?: 19.5 years Where was the patient employed at that time?: [employer]  Has patient ever served in combat?: No Did You Receive Any Psychiatric Treatment/Services While in Equities traderthe Military?: No Are There Guns or Other Weapons in Your Home?: Yes Types of Guns/Weapons: Handguns, rifle Are These ComptrollerWeapons Safely Secured?: Yes  Education: Education School Currently Attending: N/A: Adult Last Grade Completed: 12 Name of High School: [employer]  Did Garment/textile technologistYou Graduate From [employer]?: Yes Did Theme park managerYou Attend College?: No Did Designer, television/film setYou Attend Graduate School?: No Did You Have Any Special Interests In School?: Softball Did You Have An Individualized Education Program (IIEP): No Did You Have Any Difficulty At School?: No  Religion: Religion/Spirituality Are You A Religious Person?: Yes What is Your Religious Affiliation?: [employer] How Might This Affect Treatment?: Support in treatment  Leisure/Recreation: Leisure / Recreation Leisure and Hobbies: Watch tv, watch son play golf   Exercise/Diet: Exercise/Diet Do You Exercise?: No Have You Gained or Lost A Significant Amount of Weight in the Past Six Months?: Yes-Gained Number of Pounds Gained: 25 Do You Follow a Special Diet?: No Do You Have Any Trouble Sleeping?: Yes Explanation of Sleeping Difficulties: intense dreams   CCA Part Two C  Alcohol/Drug Use: Alcohol / Drug Use Pain Medications: See patient record Prescriptions: See patient record Over the Counter: See patient record  History of alcohol / drug use?: No history of alcohol / drug abuse(increase in  alcohol use this year but no abuse)                      CCA Part Three  ASAM's:  Six Dimensions of Multidimensional Assessment  Dimension 1:  Acute Intoxication and/or Withdrawal Potential:  Dimension 1:  Comments: None  Dimension 2:  Biomedical Conditions and Complications:  Dimension 2:  Comments: None  Dimension 3:  Emotional, Behavioral, or Cognitive Conditions and Complications:  Dimension 3:  Comments: None  Dimension 4:  Readiness to Change:  Dimension 4:  Comments: None  Dimension 5:  Relapse, Continued use, or Continued Problem Potential:  Dimension 5:  Comments: None  Dimension 6:  Recovery/Living Environment:  Dimension 6:  Recovery/Living Environment Comments: None    Substance use Disorder (SUD)    Social Function:  Social Functioning Social Maturity: Isolates Social Judgement: Normal  Stress:  Stress Stressors: Illness Coping Ability: Overwhelmed Patient Takes Medications The Way The Doctor Instructed?: Yes Priority Risk: Low Acuity  Risk Assessment- Self-Harm Potential: Risk Assessment For Self-Harm Potential Thoughts of Self-Harm: No current thoughts Method: No plan Availability of Means: No access/NA  Risk Assessment -Dangerous to Others Potential: Risk Assessment For Dangerous to Others Potential Method: No Plan Availability of Means: No access or NA Intent: Vague intent or NA  DSM5 Diagnoses: Patient Active Problem List   Diagnosis Date Noted  . GERD (gastroesophageal reflux disease) 12/31/2015    Patient Centered Plan: Patient is on the following Treatment Plan(s):  Anxiety and Depression  Recommendations for Services/Supports/Treatments: Recommendations for Services/Supports/Treatments Recommendations For Services/Supports/Treatments: Individual Therapy, Medication Management  Treatment Plan Summary: OP Treatment Plan Summary: Aliz will reduce symptoms of depression as evidenced by "finding out who I am and what is normal,  finding out where I belong" and reduce depression for 5 out of 7 days for 60 days.     Patient is a 47 year old Caucasian female that presents oriented x5 (person, place, situation, time and object) alert, depressed, neatly dressed, appropriately groomed, average height, average weight, and cooperative for an assessment on a self referral to address mood and anxiety. Patient has a history of medical treatment including GERD and hypothyrodism. She has a history of mental health treatment including outpatient therapy and medication management. Patient admits to symptoms of mania including euphoria, irritability, and increased energy. Patient denies suicidal and homicidal ideations. Patient denies psychosis including auditory and visual hallucinations. Patient denies substance abuse but admits to an increase in alcohol use this year. Patient denies history of elopement. She is at low risk for lethality at this time. Patient would benefit from outpatient therapy with a CBT approach 1-4 times a month to address mood. Patient would also benefit from continued medication management to manage mood and anxiety.  Referrals to Alternative Service(s): Referred to Alternative Service(s):   Place:   Date:   Time:    Referred to Alternative Service(s):   Place:   Date:   Time:    Referred to Alternative Service(s):   Place:   Date:   Time:    Referred to Alternative Service(s):   Place:   Date:   Time:     Bynum Bellows

## 2017-09-17 ENCOUNTER — Encounter (HOSPITAL_COMMUNITY): Payer: Self-pay | Admitting: Licensed Clinical Social Worker

## 2017-09-17 ENCOUNTER — Ambulatory Visit (INDEPENDENT_AMBULATORY_CARE_PROVIDER_SITE_OTHER): Payer: Commercial Managed Care - PPO | Admitting: Licensed Clinical Social Worker

## 2017-09-17 DIAGNOSIS — F331 Major depressive disorder, recurrent, moderate: Secondary | ICD-10-CM

## 2017-09-17 NOTE — Progress Notes (Signed)
   THERAPIST PROGRESS NOTE  Session Time: 9:00 am-9:55 am  Participation Level: Active  Behavioral Response: CasualAlertDepressed  Type of Therapy: Individual Therapy  Treatment Goals addressed: Coping  Interventions: CBT and Solution Focused  Summary: Kaylee Boyer is a 47 y.o. female who presents with Patient is a 47 year old Caucasian female that presents oriented x5 (person, place, situation, time and object) alert, depressed, neatly dressed, appropriately groomed, average height, average weight, and cooperative for an assessment on a self referral to address mood and anxiety. Patient has a history of medical treatment including GERD and hypothyrodism. She has a history of mental health treatment including outpatient therapy and medication management. Patient admits to symptoms of mania including euphoria, irritability, and increased energy. Patient denies suicidal and homicidal ideations. Patient denies psychosis including auditory and visual hallucinations. Patient denies substance abuse but admits to an increase in alcohol use this year. Patient denies history of elopement. She is at low risk for lethality at this time.   Physically: Patient is having nightmares and stomach aches. She feels fatigued during the day and is not showering or brushing her teeth daily.  Spiritually/values: Patient wants to go to church but has not gone due to feeling tired and depressed.  Relationships: Patient has a strained relationship with her mother. She feels like her mother competes with her or puts her down. Patient feels like her husband is supportive but doesn't fully understand. She feels like her mood is impacting her relationship. Mental/Emotional/Behavioral: Patient is depressed. She feels overwhelmed by expectations which can "paralize" her. Patient feels like she can't sleep due to nightmares, struggles with motivation, zones out, doesn't know what her normal is and never felt love as a  child. Patient agreed to work on taking a shower 3 times a week on Sunday, Tues and Thurs. To break her pattern.   Suicidal/Homicidal: Negativewithout intent/plan  Therapist Response: Therapist reviewed patient's recent thoughts and behavior. Therapist utilized CBT and Solution focused therapy to address mood. Therapist processed patient's feelings to identify triggers for mood. Therapist discussed the importance of hygiene and how it impacts mood.   Plan: Return again in 1 weeks.  Diagnosis: Axis I: Major depressive disorder, recurrent episode, moderate with anxious distress    Axis II: No diagnosis    Kaylee BellowsJoshua Darcella Shiffman, LCSW 09/17/2017

## 2017-09-24 ENCOUNTER — Ambulatory Visit (HOSPITAL_COMMUNITY): Payer: Self-pay | Admitting: Licensed Clinical Social Worker

## 2017-10-01 ENCOUNTER — Encounter (HOSPITAL_COMMUNITY): Payer: Self-pay | Admitting: Licensed Clinical Social Worker

## 2017-10-01 ENCOUNTER — Ambulatory Visit (HOSPITAL_COMMUNITY): Payer: Commercial Managed Care - PPO | Admitting: Licensed Clinical Social Worker

## 2017-10-01 DIAGNOSIS — F331 Major depressive disorder, recurrent, moderate: Secondary | ICD-10-CM | POA: Diagnosis not present

## 2017-10-01 NOTE — Progress Notes (Signed)
   THERAPIST PROGRESS NOTE  Session Time: 9:00 am-9:55 am  Participation Level: Active  Behavioral Response: CasualAlertDepressed  Type of Therapy: Individual Therapy  Treatment Goals addressed: Coping  Interventions: CBT and Solution Focused  Summary: Otho NajjarKimberly D Logie is a 47 y.o. female who presents with Patient is a 47 year old Caucasian female that presents oriented x5 (person, place, situation, time and object) alert, depressed, neatly dressed, appropriately groomed, average height, average weight, and cooperative for an assessment on a self referral to address mood and anxiety. Patient has a history of medical treatment including GERD and hypothyrodism. She has a history of mental health treatment including outpatient therapy and medication management. Patient admits to symptoms of mania including euphoria, irritability, and increased energy. Patient denies suicidal and homicidal ideations. Patient denies psychosis including auditory and visual hallucinations. Patient denies substance abuse but admits to an increase in alcohol use this year. Patient denies history of elopement. She is at low risk for lethality at this time.   Physically: Patient is experiencing more energy. She has had difficulty sleeping but had a good night sleep last night.  Spiritually/values: No issues identified.   Relationships: Patient feels like her role and identity has changed. She used to do everything but now her husband has taken up additional responsibilities. She worries that he gets frustrated with her.  Mental/Emotional/Behavioral: Patient's mood has improved some. She has more energy than before. Patient has been showering and brushing her teeth more than the 3 days identified. Patient identified that next step is taking on a responsibility around the house like laundry and making her bed. She agreed to continue to maintain her hygiene and do laundry.    Suicidal/Homicidal: Negativewithout  intent/plan  Therapist Response: Therapist reviewed patient's recent thoughts and behavior. Therapist utilized CBT and Solution focused therapy to address mood. Therapist processed patient's feelings to identify triggers for mood. Therapist assisted patient in identifying what her next step to feel a little better is.   Plan: Return again in 2 weeks.  Diagnosis: Axis I: Major depressive disorder, recurrent episode, moderate with anxious distress    Axis II: No diagnosis    Bynum BellowsJoshua Earlyne Feeser, LCSW 10/01/2017

## 2017-10-08 ENCOUNTER — Ambulatory Visit (HOSPITAL_COMMUNITY): Payer: Commercial Managed Care - PPO | Admitting: Licensed Clinical Social Worker

## 2017-10-15 ENCOUNTER — Ambulatory Visit (HOSPITAL_COMMUNITY): Payer: Self-pay | Admitting: Licensed Clinical Social Worker

## 2017-10-22 ENCOUNTER — Ambulatory Visit (HOSPITAL_COMMUNITY): Payer: Self-pay | Admitting: Licensed Clinical Social Worker

## 2017-11-03 ENCOUNTER — Ambulatory Visit (HOSPITAL_COMMUNITY): Payer: Self-pay | Admitting: Licensed Clinical Social Worker

## 2017-11-17 ENCOUNTER — Ambulatory Visit (INDEPENDENT_AMBULATORY_CARE_PROVIDER_SITE_OTHER): Payer: Commercial Managed Care - PPO | Admitting: Licensed Clinical Social Worker

## 2017-11-17 ENCOUNTER — Encounter (HOSPITAL_COMMUNITY): Payer: Self-pay | Admitting: Licensed Clinical Social Worker

## 2017-11-17 ENCOUNTER — Other Ambulatory Visit (HOSPITAL_COMMUNITY): Payer: Self-pay | Admitting: Licensed Clinical Social Worker

## 2017-11-17 DIAGNOSIS — F331 Major depressive disorder, recurrent, moderate: Secondary | ICD-10-CM | POA: Diagnosis not present

## 2017-11-17 NOTE — Progress Notes (Signed)
   THERAPIST PROGRESS NOTE  Session Time: 3:00 pm-3:55 pm  Participation Level: Active  Behavioral Response: CasualAlertDepressed  Type of Therapy: Individual Therapy  Treatment Goals addressed: Coping  Interventions: CBT and Solution Focused  Summary: Otho NajjarKimberly D Eich is a 47 y.o. female who presents with Patient is a 47 year old Caucasian female that presents oriented x5 (person, place, situation, time and object) alert, depressed, neatly dressed, appropriately groomed, average height, average weight, and cooperative for an assessment on a self referral to address mood and anxiety. Patient has a history of medical treatment including GERD and hypothyrodism. She has a history of mental health treatment including outpatient therapy and medication management. Patient admits to symptoms of mania including euphoria, irritability, and increased energy. Patient denies suicidal and homicidal ideations. Patient denies psychosis including auditory and visual hallucinations. Patient denies substance abuse but admits to an increase in alcohol use this year. Patient denies history of elopement. She is at low risk for lethality at this time.   Physically: Patient is sleeping a little better.  Spiritually/values: No issues identified.   Relationships: Patient feels like she is pushing her husband away. She discovered that he was viewing pornography and downloading pictures of women onto his phone. She felt really hurt by this. After discussion, patient understood that her husband made a choice and she didn't cause or force him to view pornography. Patient doesn't have close relationships with anyone outside of her family.  Mental/Emotional/Behavioral: Patient is feeling empty. She doesn't know the meaning of her life anymore. She doesn't know what to do. Patient is not suicidal. She is taking life day by day. She is feeling overwhelmed and empty.After discussion, patient understood that she needs to take  things day by day and break tasks into manageable pieces.     Suicidal/Homicidal: Negativewithout intent/plan  Therapist Response: Therapist reviewed patient's recent thoughts and behavior. Therapist utilized CBT and Solution focused therapy to address mood. Therapist processed patient's feelings to identify triggers for mood. Therapist discussed with patient taking small steps to can her behavior and in turn change her mood.   Plan: Return again in 2 weeks.  Diagnosis: Axis I: Major depressive disorder, recurrent episode, moderate with anxious distress    Axis II: No diagnosis    Bynum BellowsJoshua Sherill Mangen, LCSW 11/17/2017

## 2017-12-13 ENCOUNTER — Ambulatory Visit (HOSPITAL_COMMUNITY): Payer: Self-pay | Admitting: Licensed Clinical Social Worker

## 2018-01-10 ENCOUNTER — Ambulatory Visit (HOSPITAL_COMMUNITY): Payer: Commercial Managed Care - PPO | Admitting: Licensed Clinical Social Worker

## 2018-01-31 ENCOUNTER — Ambulatory Visit (HOSPITAL_COMMUNITY): Payer: Self-pay | Admitting: Licensed Clinical Social Worker

## 2018-02-28 ENCOUNTER — Ambulatory Visit (HOSPITAL_COMMUNITY): Payer: Self-pay | Admitting: Licensed Clinical Social Worker

## 2018-08-09 ENCOUNTER — Ambulatory Visit (INDEPENDENT_AMBULATORY_CARE_PROVIDER_SITE_OTHER): Payer: Self-pay | Admitting: Internal Medicine

## 2018-08-25 ENCOUNTER — Encounter: Payer: Self-pay | Admitting: Internal Medicine

## 2018-08-29 ENCOUNTER — Encounter (INDEPENDENT_AMBULATORY_CARE_PROVIDER_SITE_OTHER): Payer: Self-pay | Admitting: Internal Medicine

## 2018-08-29 ENCOUNTER — Ambulatory Visit (INDEPENDENT_AMBULATORY_CARE_PROVIDER_SITE_OTHER): Payer: Commercial Managed Care - PPO | Admitting: Internal Medicine

## 2018-08-29 VITALS — BP 89/51 | HR 71 | Temp 98.1°F | Resp 18 | Ht 64.0 in | Wt 194.7 lb

## 2018-08-29 DIAGNOSIS — K582 Mixed irritable bowel syndrome: Secondary | ICD-10-CM

## 2018-08-29 MED ORDER — BISACODYL 10 MG RE SUPP: RECTAL | 0 refills | Status: DC

## 2018-08-29 MED ORDER — INULIN 1.5 G PO CHEW: 3.0000 g | CHEWABLE_TABLET | Freq: Every day | ORAL | Status: DC

## 2018-08-29 MED ORDER — DICYCLOMINE HCL 10 MG PO CAPS: 10.0000 mg | ORAL_CAPSULE | Freq: Two times a day (BID) | ORAL | 5 refills | Status: DC

## 2018-08-29 NOTE — Progress Notes (Signed)
Reason for consultation;  Change in bowel habits.  History of present illness:  Patient is 48 year old Caucasian female who is referred through courtesy of Ms. Linus Salmons, FNP of [facility] in [place] for GI evaluation. Patient was last seen in our office in August 2017 for gastroesophageal reflux disease.  She says her GERD symptoms have resolved with lifestyle modifications and she is not even taking any medication. She gives history of chronic constipation.  She would go as many as 5 days without a bowel movement.  He used to take Ex-Lax.  Then starting in 2017 she began to take 2 magnesium pills a day and it helped with bowel movements.  However her bowel habits have changed in the last 1 year.  She is therefore not taking magnesium but once in a while.  She has noted postprandial urgency diarrhea as well as explosive bowel movements.  She says stool consistency and color has changed but she denies melena or rectal bleeding.  When she has urgency and explosive bowel movement she also has diaphoreses and feels tired for few hours.  She also has had intermittent accidents.  She had one episode while she was at [facility].  She also has intermittent nausea but no vomiting.  She states she has lost 20 pounds in the last year or so.  She has joined [employer].  Her appetite is fair.  She usually does not eat breakfast.  Her biggest meal is supper.  She may have a small lunch.  She wonders if she should have a colonoscopy now or wait until she is 48. She states she had blood work by Dr. Neita Carp last week and was all normal.  It is reviewed under lab data.   Current Medications: Outpatient Encounter Medications as of 08/29/2018  Medication Sig  . ALPRAZolam (XANAX) 1 MG tablet Take 2 mg by mouth at bedtime as needed for anxiety.  . citalopram (CELEXA) 40 MG tablet Take 40 mg by mouth at bedtime.  . divalproex (DEPAKOTE ER) 500 MG 24 hr tablet Take 500 mg by mouth. Patient takes 2 by mouth  at bedtime.  Marland Kitchen MAGNESIUM PO Take 2 tablets by mouth daily.  . norgestimate-ethinyl estradiol (MONO-LINYAH) 0.25-35 MG-MCG tablet Take as directed  . QUEtiapine (SEROQUEL) 200 MG tablet Take 200 mg by mouth at bedtime. 8 pm at night.  Marland Kitchen SYNTHROID 125 MCG tablet Take 125 mcg by mouth daily.  . [DISCONTINUED] dicyclomine (BENTYL) 10 MG capsule Take 1 capsule (10 mg total) by mouth 3 (three) times daily before meals. (Patient not taking: Reported on 08/29/2018)  . [DISCONTINUED] esomeprazole (NEXIUM) 40 MG capsule Take 1 capsule (40 mg total) by mouth daily. (Patient not taking: Reported on 08/29/2018)  . [DISCONTINUED] FLUoxetine (PROZAC) 20 MG capsule Take 40 mg by mouth daily.  . [DISCONTINUED] GIANVI 3-0.02 MG tablet Take 1 tablet by mouth daily.  . [DISCONTINUED] HYDROcodone-acetaminophen (NORCO/VICODIN) 5-325 MG tablet Take 1 tablet by mouth every 6 (six) hours as needed for moderate pain. (Patient not taking: Reported on 08/29/2018)  . [DISCONTINUED] promethazine (PHENERGAN) 25 MG tablet Take 1 tablet (25 mg total) by mouth every 8 (eight) hours as needed for nausea or vomiting. (Patient not taking: Reported on 08/29/2018)  . [DISCONTINUED] traZODone (DESYREL) 50 MG tablet Take 50-100 mg by mouth at bedtime.    No facility-administered encounter medications on file as of 08/29/2018.    Past medical history:  Past Medical History:  Diagnosis Date  . Hypothyroidism   .  Thyroid disease        Bipolar disorder.      Obesity.  BMI 33.42.     History of GERD.  Presently on dietary measures.  Past Surgical History:  Procedure Laterality Date  . CHOLECYSTECTOMY      6 yrs ago  . ESOPHAGOGASTRODUODENOSCOPY N/A 01/16/2016   Procedure: ESOPHAGOGASTRODUODENOSCOPY (EGD);  Surgeon: Malissa Hippo, MD;  Location: [facility];  Service: Endoscopy;  Laterality: N/A;  10:30    Allergies: Allergies  Allergen Reactions  . Codeine Rash    Family history:  Father died of myelodysplastic syndrome at age  70. Mother is 38 years old.  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  She has 1 brother age 5 living.  Social history:  She is married and accompanied by her husband today.  She has been on disability since February 2019.  She does not smoke cigarettes and drinks alcohol occasionally.  She has 1 son in good health.  Physical examination:  Blood pressure (!) 89/51, pulse 71, temperature 98.1 F (36.7 C), temperature source Oral, resp. rate 18, height 5\' 4"  (1.626 m), weight 194 lb 11.2 oz (88.3 kg). Patient is alert and in no acute distress. Conjunctiva is pink. Sclera is nonicteric Oropharyngeal mucosa is normal. No neck masses or thyromegaly noted. Cardiac exam with regular rhythm normal S1 and S2. No murmur or gallop noted. Lungs are clear to auscultation. Abdomen;  No LE edema or clubbing noted.  Labs/studies Results: Lab data from 08/25/2018 WBC 7.7, H&H 13.4 and 40.6 and platelet count 324K. Glucose 87 BUN 13 creatinine 1.04 Serum sodium 140, potassium 4.4, chloride 101, CO2 24 Serum calcium 9.1. Bilirubin 0.2, AP 51, AST 18, ALT 10, total protein 6.0 and albumin 3.4. TSH 2.100  Assessment:  Patient is 48 year old Caucasian female who has noted  change in her bowel habits.  She had constipation for years but now she has developed postprandial diarrhea with urgency and has had few accidents.  No history of rectal bleeding.  She is also having explosive bowel movements with diaphoreses and and weakness.  It appears her weight loss appears to be voluntary. Her symptoms appear to be typical of irritable bowel syndrome.  She just has gone from one extreme to the other.  If she does not respond to therapy would proceed with colonoscopy otherwise she could wait until she turns 48. She is also experiencing intermittent nausea which may be due to her medications or IBS.   Recommendation:  Patient advised not to take magnesium  anymore. Dicyclomine 10 mg before breakfast and evening meal daily. Fiberchoice 3 g by mouth daily. Increase intake of fiber rich foods as tolerated. Patient advised to use Dulcolax suppository when she has an urge and unable to have a bowel movement or if she goes more than 1 day without a bowel movement. Office visit in 8 weeks.

## 2018-08-29 NOTE — Patient Instructions (Addendum)
Increase intake of fiber rich foods as discussed. Use Dulcolax suppository every other day if you do not have a spontaneous bowel movement or when you have an urge and unable to have a bowel movement. Stool diary as to frequency and consistency of stools and also on days when you use Dulcolax suppository. Will request copy of recent blood work from Dr. Dian Situ office.

## 2018-09-29 ENCOUNTER — Encounter (HOSPITAL_COMMUNITY): Payer: Self-pay | Admitting: Emergency Medicine

## 2018-09-29 ENCOUNTER — Emergency Department (HOSPITAL_COMMUNITY): Payer: Commercial Managed Care - PPO

## 2018-09-29 ENCOUNTER — Other Ambulatory Visit: Payer: Self-pay

## 2018-09-29 ENCOUNTER — Observation Stay (HOSPITAL_COMMUNITY)
Admission: EM | Admit: 2018-09-29 | Discharge: 2018-09-30 | Disposition: A | Discharge status: Home / Self Care | Payer: Commercial Managed Care - PPO | Attending: Family Medicine | Admitting: Family Medicine

## 2018-09-29 DIAGNOSIS — E039 Hypothyroidism, unspecified: Secondary | ICD-10-CM | POA: Diagnosis not present

## 2018-09-29 DIAGNOSIS — F319 Bipolar disorder, unspecified: Secondary | ICD-10-CM

## 2018-09-29 DIAGNOSIS — F419 Anxiety disorder, unspecified: Secondary | ICD-10-CM

## 2018-09-29 DIAGNOSIS — Z79899 Other long term (current) drug therapy: Secondary | ICD-10-CM | POA: Diagnosis not present

## 2018-09-29 DIAGNOSIS — R079 Chest pain, unspecified: Principal | ICD-10-CM | POA: Insufficient documentation

## 2018-09-29 DIAGNOSIS — F329 Major depressive disorder, single episode, unspecified: Secondary | ICD-10-CM | POA: Insufficient documentation

## 2018-09-29 HISTORY — DX: Bipolar disorder, unspecified: F31.9

## 2018-09-29 HISTORY — DX: Anxiety disorder, unspecified: F41.9

## 2018-09-29 HISTORY — DX: Major depressive disorder, single episode, unspecified: F32.9

## 2018-09-29 LAB — BASIC METABOLIC PANEL
Anion gap: 8 (ref 5–15)
BUN: 12 mg/dL (ref 6–20)
CHLORIDE: 106 mmol/L (ref 98–111)
CO2: 23 mmol/L (ref 22–32)
Calcium: 8.5 mg/dL — ABNORMAL LOW (ref 8.9–10.3)
Creatinine, Ser: 1.08 mg/dL — ABNORMAL HIGH (ref 0.44–1.00)
GFR calc non Af Amer: >60 mL/min (ref >60)
Glucose, Bld: 102 mg/dL — ABNORMAL HIGH (ref 70–99)
Potassium: 3.9 mmol/L (ref 3.5–5.1)
SODIUM: 137 mmol/L (ref 135–145)

## 2018-09-29 LAB — CBC
HEMATOCRIT: 39.4 % (ref 36.0–46.0)
Hemoglobin: 12.2 g/dL (ref 12.0–15.0)
MCH: 31.5 pg (ref 26.0–34.0)
MCHC: 31 g/dL (ref 30.0–36.0)
MCV: 101.8 fL — ABNORMAL HIGH (ref 80.0–100.0)
Platelets: 328 10*3/uL (ref 150–400)
RBC: 3.87 MIL/uL (ref 3.87–5.11)
RDW: 11.9 % (ref 11.5–15.5)
WBC: 5.9 10*3/uL (ref 4.0–10.5)
nRBC: 0 % (ref 0.0–0.2)

## 2018-09-29 LAB — TROPONIN I
Troponin I: <0.03 ng/mL (ref <0.03)
Troponin I: <0.03 ng/mL (ref <0.03)
Troponin I: <0.03 ng/mL (ref <0.03)

## 2018-09-29 LAB — POCT I-STAT TROPONIN I: Troponin i, poc: 0.01 ng/mL (ref 0.00–0.08)

## 2018-09-29 LAB — GLUCOSE, CAPILLARY: Glucose-Capillary: 86 mg/dL (ref 70–99)

## 2018-09-29 MED ORDER — IOHEXOL 350 MG/ML SOLN
100.0000 mL | Freq: Once | INTRAVENOUS | Status: AC | PRN
  Administered 2018-09-29: 100 mL via INTRAVENOUS

## 2018-09-29 MED ORDER — CITALOPRAM HYDROBROMIDE 20 MG PO TABS
40.0000 mg | ORAL_TABLET | Freq: Every day | ORAL | Status: DC
  Administered 2018-09-29: 40 mg via ORAL
  Filled 2018-09-29: qty 2

## 2018-09-29 MED ORDER — BISACODYL 10 MG RE SUPP: 10.0000 mg | Freq: Every day | RECTAL | Status: DC | PRN

## 2018-09-29 MED ORDER — ALPRAZOLAM 1 MG PO TABS
2.0000 mg | ORAL_TABLET | Freq: Every day | ORAL | Status: DC
  Administered 2018-09-29: 2 mg via ORAL
  Filled 2018-09-29: qty 2

## 2018-09-29 MED ORDER — ENOXAPARIN SODIUM 40 MG/0.4ML ~~LOC~~ SOLN
40.0000 mg | SUBCUTANEOUS | Status: DC
  Administered 2018-09-29: 40 mg via SUBCUTANEOUS
  Filled 2018-09-29: qty 0.4

## 2018-09-29 MED ORDER — ACETAMINOPHEN 325 MG PO TABS
650.0000 mg | ORAL_TABLET | ORAL | Status: DC | PRN
  Administered 2018-09-30: 650 mg via ORAL
  Filled 2018-09-29: qty 2

## 2018-09-29 MED ORDER — QUETIAPINE FUMARATE 100 MG PO TABS
200.0000 mg | ORAL_TABLET | ORAL | Status: DC
  Administered 2018-09-29: 200 mg via ORAL
  Filled 2018-09-29: qty 2

## 2018-09-29 MED ORDER — LEVOTHYROXINE SODIUM 125 MCG PO TABS: 125.0000 ug | ORAL_TABLET | Freq: Every day | ORAL | Status: DC

## 2018-09-29 MED ORDER — ASPIRIN EC 325 MG PO TBEC
325.0000 mg | DELAYED_RELEASE_TABLET | Freq: Every day | ORAL | Status: DC
  Filled 2018-09-29: qty 1

## 2018-09-29 MED ORDER — DICYCLOMINE HCL 10 MG PO CAPS
10.0000 mg | ORAL_CAPSULE | Freq: Two times a day (BID) | ORAL | Status: DC
  Filled 2018-09-29 (×2): qty 1

## 2018-09-29 MED ORDER — ASPIRIN 81 MG PO CHEW
324.0000 mg | CHEWABLE_TABLET | Freq: Once | ORAL | Status: AC
  Administered 2018-09-29: 324 mg via ORAL
  Filled 2018-09-29: qty 4

## 2018-09-29 MED ORDER — PRAZOSIN HCL 1 MG PO CAPS
1.0000 mg | ORAL_CAPSULE | Freq: Every day | ORAL | Status: DC
  Filled 2018-09-29 (×4): qty 1

## 2018-09-29 MED ORDER — NITROGLYCERIN 2 % TD OINT
1.0000 [in_us] | TOPICAL_OINTMENT | Freq: Four times a day (QID) | TRANSDERMAL | Status: DC
  Administered 2018-09-29: 1 [in_us] via TOPICAL
  Filled 2018-09-29: qty 1

## 2018-09-29 MED ORDER — ONDANSETRON HCL 4 MG/2ML IJ SOLN: 4.0000 mg | Freq: Four times a day (QID) | INTRAMUSCULAR | Status: DC | PRN

## 2018-09-29 NOTE — ED Provider Notes (Signed)
[facility] EMERGENCY DEPARTMENT Provider Note   CSN: [id number] Arrival date & time: 09/29/18  9794    History   Chief Complaint Chief Complaint  Patient presents with  . Chest Pain    HPI Kaylee Boyer is a 48 y.o. female.  HPI: A 48 year old patient presents for evaluation of chest pain. Initial onset of pain was more than 6 hours ago. The patient's chest pain is described as heaviness/pressure/tightness and is worse with exertion. The patient's chest pain is middle- or left-sided, is not well-localized, is not sharp and does radiate to the arms/jaw/neck. The patient does not complain of nausea and denies diaphoresis. The patient has no history of stroke, has no history of peripheral artery disease, has not smoked in the past 90 days, denies any history of treated diabetes, has no relevant family history of coronary artery disease (first degree relative at less than age 15), is not hypertensive, has no history of hypercholesterolemia and does not have an elevated BMI (>=30).   HPI Initially started off an on a month ago.  Recently has been getting worse. Patient has noticed that she is had some discomfort, fatigue and shortness of breath  with exercise activity.  This is new in the last couple of days.  She denies any recent trips or travel.  She has noticed some mild ankle swelling bilaterally but none today.  No history of PE or DVT. Past Medical History:  Diagnosis Date  . Anxiety   . Bipolar disease, chronic (HCC)   . Hypothyroidism   . Major depression   . Thyroid disease     Patient Active Problem List   Diagnosis Date Noted  . GERD (gastroesophageal reflux disease) 12/31/2015    Past Surgical History:  Procedure Laterality Date  . CHOLECYSTECTOMY      6 yrs ago  . ESOPHAGOGASTRODUODENOSCOPY N/A 01/16/2016   Procedure: ESOPHAGOGASTRODUODENOSCOPY (EGD);  Surgeon: Malissa Hippo, MD;  Location: [facility];  Service: Endoscopy;  Laterality: N/A;  10:30     OB  History   No obstetric history on file.      Home Medications    Prior to Admission medications   Medication Sig Start Date End Date Taking? Authorizing Provider  ALPRAZolam Prudy Feeler) 1 MG tablet Take 2 mg by mouth at bedtime as needed for anxiety.    [provider]  bisacodyl (DULCOLAX) 10 MG suppository Use Dulcolax suppository as directed. 08/29/18   Malissa Hippo, MD  citalopram (CELEXA) 40 MG tablet Take 40 mg by mouth at bedtime.    [provider]  dicyclomine (BENTYL) 10 MG capsule Take 1 capsule (10 mg total) by mouth 2 (two) times daily before a meal. 08/29/18   Rehman, Joline Maxcy, MD  divalproex (DEPAKOTE ER) 500 MG 24 hr tablet Take 500 mg by mouth. Patient takes 2 by mouth at bedtime.    [provider]  Inulin (FIBER CHOICE) 1.5 g CHEW Chew 3 g by mouth daily. 08/29/18   Malissa Hippo, MD  norgestimate-ethinyl estradiol [facility]) 0.25-35 MG-MCG tablet Take as directed 10/23/17   [provider]  QUEtiapine (SEROQUEL) 200 MG tablet Take 200 mg by mouth at bedtime. 8 pm at night.    [provider]  SYNTHROID 125 MCG tablet Take 125 mcg by mouth daily. 12/05/15   [provider]    Family History No family history on file.  Social History Social History   Tobacco Use  . Smoking status: Never Smoker  .  Smokeless tobacco: Never Used  Substance Use Topics  . Alcohol use: No    Alcohol/week: 0.0 standard drinks  . Drug use: No     Allergies   Codeine   Review of Systems Review of Systems  Neurological: Positive for dizziness ( In the last couple of days with certain positions, tilting her head back makes it worse).  All other systems reviewed and are negative.    Physical Exam Updated Vital Signs BP (!) 101/52   Pulse 69   Temp 98.7 F (37.1 C) (Oral)   Resp 10   Ht 1.626 m (5\' 4" )   Wt 88 kg   SpO2 98%   BMI 33.30 kg/m   Physical Exam Vitals signs and nursing note reviewed.  Constitutional:        General: She is not in acute distress.    Appearance: She is well-developed.  HENT:     Head: Normocephalic and atraumatic.     Right Ear: External ear normal.     Left Ear: External ear normal.  Eyes:     General: No scleral icterus.       Right eye: No discharge.        Left eye: No discharge.     Conjunctiva/sclera: Conjunctivae normal.  Neck:     Musculoskeletal: Neck supple.     Trachea: No tracheal deviation.  Cardiovascular:     Rate and Rhythm: Normal rate and regular rhythm.  Pulmonary:     Effort: Pulmonary effort is normal. No respiratory distress.     Breath sounds: Normal breath sounds. No stridor. No wheezing or rales.  Abdominal:     General: Bowel sounds are normal. There is no distension.     Palpations: Abdomen is soft.     Tenderness: There is no abdominal tenderness. There is no guarding or rebound.  Musculoskeletal:        General: No tenderness.  Skin:    General: Skin is warm and dry.     Findings: No rash.  Neurological:     Mental Status: She is alert.     Cranial Nerves: No cranial nerve deficit (no facial droop, extraocular movements intact, no slurred speech).     Sensory: No sensory deficit.     Motor: No abnormal muscle tone or seizure activity.     Coordination: Coordination normal.      ED Treatments / Results  Labs (all labs ordered are listed, but only abnormal results are displayed) Labs Reviewed  BASIC METABOLIC PANEL - Abnormal; Notable for the following components:      Result Value   Glucose, Bld 102 (*)    Creatinine, Ser 1.08 (*)    Calcium 8.5 (*)    All other components within normal limits  CBC - Abnormal; Notable for the following components:   MCV 101.8 (*)    All other components within normal limits  TROPONIN I    EKG EKG Interpretation  Date/Time:  Thursday September 29 2018 09:48:35 EST Ventricular Rate:  89 PR Interval:  136 QRS Duration: 74 QT Interval:  380 QTC Calculation: 462 R Axis:   81 Text  Interpretation:  Normal sinus rhythm Nonspecific ST abnormality Abnormal ECG Confirmed by Vanetta Mulders [phone]) on 09/29/2018 10:00:09 AM   Radiology Dg Chest 2 View  Result Date: 09/29/2018 CLINICAL DATA:  Chest pain, heaviness on chest, sob, and dizziness on and off for a few weeks now. Pt has had a couple of episodes of feeling like she  was going to pass out. Productive cough at times. Fatigue. cp x 1 month EXAM: CHEST - 2 VIEW COMPARISON:  None. FINDINGS: Normal mediastinum and cardiac silhouette. Normal pulmonary vasculature. No evidence of effusion, infiltrate, or pneumothorax. No acute bony abnormality. IMPRESSION: No acute cardiopulmonary process. Electronically Signed   By: Genevive Bi M.D.   On: 09/29/2018 10:31    Procedures Procedures (including critical care time)  Medications Ordered in ED Medications - No data to display   Initial Impression / Assessment and Plan / ED Course  I have reviewed the triage vital signs and the nursing notes.  Pertinent labs & imaging results that were available during my care of the patient were reviewed by me and considered in my medical decision making (see chart for details).  Clinical Course as of Sep 29 1434  Thu Sep 29, 2018  1403 Pt now states she is having some numbness or in her face, chest and arm.  Will ct to evaluate for aortic disease   [JK]    Clinical Course User Index [JK] Linwood Dibbles, MD    HEAR Score: 4  Moderate risk.    Patient presented to the emergency room for evaluation of chest pain.  Patient symptoms are concerning for the possibility of acute coronary syndrome.  Patient's had chest pressure that is now induced by exercise and activity.  Describes some numbness on the left side so a CT scan was performed to rule out aortic dissection.  No acute finding noted with that.  Patient has a moderate risk heart score.  Considering her escalating symptoms I will consult the medical service for admission, serial cardiac  enzymes and possible cardiology consultation.   Final Clinical Impressions(s) / ED Diagnoses   Final diagnoses:  Chest pain, unspecified type       Linwood Dibbles, MD 09/29/18 1700

## 2018-09-29 NOTE — H&P (Addendum)
History and Physical    Kaylee Boyer ZOX:[id number] DOB: [birth year] DOA: 09/29/2018  PCP: Estanislado Pandy, MD  Patient coming from: Home  I have personally briefly reviewed patient's old medical records in [facility]  Chief Complaint: Chest pain  HPI: Kaylee Boyer is a 48 y.o. female with medical history significant for hypothyroidism, bipolar disorder, anxiety, major depression, and nightmares presents the ED with chest pain.  Patient reports intermittent episodes of pressure-like sternal chest pain which initially started about a month ago.  She has recently started exercising and noticed during activity she has developed this chest pain associated with palpitations, shortness of breath, fatigue, and lightheadedness without syncope.  She had an episode of chest pain at rest within the last 2 days.  She says her chest pain generally is at the sternum but has had radiation to her neck and jaw with numbness.  Due to the continued symptoms, she called her PCP who recommended she present to the ED.  She is a never smoker, reports only occasional alcohol use about twice a month, and denies any illicit drug use.  She reports a history of atrial fibrillation in her mother but is not aware of any heart attack or coronary artery disease in immediate family members.  She reports a recent trip to [place] and noticed some swelling around her ankles.  She has no history of PE or DVT.  ED Course:  In the ED, initial vitals showed BP 106/50, pulse 84, RR 18, temp 98.7 Fahrenheit, SPO2 98% on room air.  Labs are notable for troponin I <0.03 x 2.  CBC and BMP are largely unremarkable.  EKG showed normal sinus rhythm with motion artifact and no acute ischemic changes.  2 view chest x-ray was without evidence of focal consolidation, effusion, infiltrate.  CTA chest with contrast was negative for aortic dissection, aneurysm, atherosclerosis, or pulmonary embolus.  Patient was given aspirin 324 mg  once, Nitropaste, and the hospitalist service was consulted to admit for chest pain rule out.  Review of Systems: As per HPI otherwise 10 point review of systems negative.    Past Medical History:  Diagnosis Date  . Anxiety   . Bipolar disease, chronic (HCC)   . Hypothyroidism   . Major depression   . Thyroid disease     Past Surgical History:  Procedure Laterality Date  . CHOLECYSTECTOMY      6 yrs ago  . ESOPHAGOGASTRODUODENOSCOPY N/A 01/16/2016   Procedure: ESOPHAGOGASTRODUODENOSCOPY (EGD);  Surgeon: Malissa Hippo, MD;  [facility]: [facility];  Service: Endoscopy;  Laterality: N/A;  10:30     reports that she has never smoked. She has never used smokeless tobacco. She reports current alcohol use. She reports that she does not use drugs.  Allergies  Allergen Reactions  . Codeine Rash    Family History  Problem Relation Age of Onset  . Atrial fibrillation Mother   . Myelodysplastic syndrome Father      Prior to Admission medications   Medication Sig Start Date End Date Taking? Authorizing Provider  ALPRAZolam Prudy Feeler) 1 MG tablet Take 2 mg by mouth at bedtime. *May take one tablet three times daily as needed for anxiety   Yes [provider]  bisacodyl (DULCOLAX) 10 MG suppository Use Dulcolax suppository as directed. Patient taking differently: Place 10 mg rectally daily as needed for mild constipation or moderate constipation.  08/29/18  Yes Rehman, Joline Maxcy, MD  citalopram (CELEXA) 40 MG tablet Take 40  mg by mouth at bedtime.   Yes [provider]  dicyclomine (BENTYL) 10 MG capsule Take 1 capsule (10 mg total) by mouth 2 (two) times daily before a meal. 08/29/18  Yes Rehman, Joline Maxcy, MD  norgestimate-ethinyl estradiol (MONO-LINYAH) 0.25-35 MG-MCG tablet Take 1 tablet by mouth daily.  10/23/17  Yes [provider]  prazosin (MINIPRESS) 1 MG capsule Take 1 mg by mouth at bedtime.   Yes [provider]  QUEtiapine (SEROQUEL) 200 MG  tablet Take 200 mg by mouth at bedtime. 8 pm at night.   Yes [provider]  SYNTHROID 125 MCG tablet Take 125 mcg by mouth every morning.  12/05/15  Yes [provider]    Physical Exam: Vitals:   09/29/18 1300 09/29/18 1330 09/29/18 1400 09/29/18 1728  BP: 110/66 (!) 113/52 111/60 116/64  Pulse: 67 68  70  Resp:    16  Temp:    98.6 F (37 C)  TempSrc:    Oral  SpO2:    100%  Weight:      Height:        Constitutional: Resting supine in bed, NAD, calm, comfortable but somewhat anxious Eyes: PERRL, lids and conjunctivae normal ENMT: Mucous membranes are moist. Posterior pharynx clear of any exudate or lesions.Normal dentition.  Neck: normal, supple, no masses. Respiratory: clear to auscultation bilaterally, no wheezing, no crackles. Normal respiratory effort. No accessory muscle use.  Cardiovascular: Regular rate and rhythm, no murmurs / rubs / gallops. No extremity edema. Abdomen: no tenderness, no masses palpated. No hepatosplenomegaly. Bowel sounds positive.  Musculoskeletal: no clubbing / cyanosis. No joint deformity upper and lower extremities. Good ROM, no contractures. Normal muscle tone.  Mild tenderness to palpation over lower sternum. Skin: no rashes, lesions, ulcers. No induration Neurologic: CN 2-12 grossly intact. Sensation intact, Strength 5/5 in all 4.  Psychiatric: Normal judgment and insight. Alert and oriented x 3. Normal mood.   Labs on Admission: I have personally reviewed following labs and imaging studies  CBC: Recent Labs  Lab 09/29/18 1054  WBC 5.9  HGB 12.2  HCT 39.4  MCV 101.8*  PLT 328   Basic Metabolic Panel: Recent Labs  Lab 09/29/18 1054  NA 137  K 3.9  CL 106  CO2 23  GLUCOSE 102*  BUN 12  CREATININE 1.08*  CALCIUM 8.5*   GFR: Estimated Creatinine Clearance: 67.6 mL/min (A) (by C-G formula based on SCr of 1.08 mg/dL (H)). Liver Function Tests: No results for input(s): AST, ALT, ALKPHOS, BILITOT, PROT, ALBUMIN  in the last 168 hours. No results for input(s): LIPASE, AMYLASE in the last 168 hours. No results for input(s): AMMONIA in the last 168 hours. Coagulation Profile: No results for input(s): INR, PROTIME in the last 168 hours. Cardiac Enzymes: Recent Labs  Lab 09/29/18 1054 09/29/18 1418  TROPONINI <0.03 <0.03   BNP (last 3 results) No results for input(s): PROBNP in the last 8760 hours. HbA1C: No results for input(s): HGBA1C in the last 72 hours. CBG: Recent Labs  Lab 09/29/18 1235  GLUCAP 86   Lipid Profile: No results for input(s): CHOL, HDL, LDLCALC, TRIG, CHOLHDL, LDLDIRECT in the last 72 hours. Thyroid Function Tests: No results for input(s): TSH, T4TOTAL, FREET4, T3FREE, THYROIDAB in the last 72 hours. Anemia Panel: No results for input(s): VITAMINB12, FOLATE, FERRITIN, TIBC, IRON, RETICCTPCT in the last 72 hours. Urine analysis:    Component Value Date/Time   COLORURINE YELLOW 12/25/2015 1234   APPEARANCEUR CLEAR 12/25/2015 1234  LABSPEC 1.023 12/25/2015 1234   PHURINE 7.5 12/25/2015 1234   GLUCOSEU NEGATIVE 12/25/2015 1234   HGBUR NEGATIVE 12/25/2015 1234   BILIRUBINUR NEGATIVE 12/25/2015 1234   KETONESUR NEGATIVE 12/25/2015 1234   PROTEINUR NEGATIVE 12/25/2015 1234   NITRITE NEGATIVE 12/25/2015 1234   LEUKOCYTESUR SMALL (A) 12/25/2015 1234    Radiological Exams on Admission: Dg Chest 2 View  Result Date: 09/29/2018 CLINICAL DATA:  Chest pain, heaviness on chest, sob, and dizziness on and off for a few weeks now. Pt has had a couple of episodes of feeling like she was going to pass out. Productive cough at times. Fatigue. cp x 1 month EXAM: CHEST - 2 VIEW COMPARISON:  None. FINDINGS: Normal mediastinum and cardiac silhouette. Normal pulmonary vasculature. No evidence of effusion, infiltrate, or pneumothorax. No acute bony abnormality. IMPRESSION: No acute cardiopulmonary process. Electronically Signed   By: Genevive Bi M.D.   On: 09/29/2018 10:31   Ct  Angio Chest Aorta W And/or Wo Contrast  Result Date: 09/29/2018 CLINICAL DATA:  LT sided CP stating "it feels like an elephant is sitting on my chest" intermittently for the past month. Pt states over the past week, she began having other symptoms including dependent edema, SOB, lightheadedness, and palpitations. No prior history. EXAM: CT ANGIOGRAPHY CHEST WITH CONTRAST TECHNIQUE: Multidetector CT imaging of the chest was performed using the standard protocol during bolus administration of intravenous contrast. Multiplanar CT image reconstructions and MIPs were obtained to evaluate the vascular anatomy. CONTRAST:  OMNIPAQUE IOHEXOL 350 MG/ML SOLN COMPARISON:  Current chest radiographs. FINDINGS: Cardiovascular: Thoracic aorta is normal in caliber. No dissection. No atherosclerosis. Aortic arch branch vessels are widely patent. Heart is normal in size and configuration. No pericardial effusion. No coronary artery calcifications. Pulmonary arteries are normal in caliber. No central pulmonary embolus. Mediastinum/Nodes: No neck base, axillary, mediastinal or hilar masses or enlarged lymph nodes. Normal trachea and esophagus. Lungs/Pleura: Minimal lung base and dependent lower lobe subsegmental atelectasis. Lungs are otherwise clear. No pleural effusion or pneumothorax. Upper Abdomen: Status post cholecystectomy.  Otherwise unremarkable. Musculoskeletal: No chest wall abnormality. No acute or significant osseous findings. Review of the MIP images confirms the above findings. IMPRESSION: 1. No aortic dissection, aneurysm or atherosclerosis. No central pulmonary embolus. 2. Normal exam other than minor subsegmental atelectasis in the lungs. Electronically Signed   By: Amie Portland M.D.   On: 09/29/2018 16:27    EKG: Independently reviewed. Normal sinus rhythm, motion artifact without acute ischemic changes.  Assessment/Plan Principal Problem:   Chest pain Active Problems:   Hypothyroidism   Major  depression   Bipolar disease, chronic (HCC)   Anxiety  Kaylee Boyer is a 48 y.o. female with medical history significant for hypothyroidism, bipolar disorder, anxiety, major depression, and nightmares who is admitted for chest pain rule out.  Chest pain: Patient with symptoms concerning for exertional angina.  Initial EKG and troponin are reassuring.  Symptoms may also be contributed by anxiety. -Admit to telemetry -Repeat troponin this evening -Repeat EKG in a.m. -Continue aspirin and Nitropaste -Echocardiogram -Consider stress testing inpatient versus outpatient, will keep n.p.o. at midnight  Hypothyroidism: Last TSH 2.1 on 08/25/2018. -Continue Synthroid  Bipolar disorder/anxiety/depression: -Continue home Xanax, citalopram, quetiapine  Nightmares: -Continue home prazosin   DVT prophylaxis: Lovenox Code Status: Full code, confirmed with patient Family Communication: No family at bedside on admission Disposition Plan: Likely discharge to home pending chest pain rule out Consults called: None Admission status: Observation   Darreld Mclean MD  [facility] Pager [phone]  If 7PM-7AM, please contact night-coverage [email or web address]  09/29/2018, 6:02 PM

## 2018-09-29 NOTE — ED Triage Notes (Signed)
Pt c/o LT sided CP stating "it feels like an elephant is sitting on my chest" intermittently for the past month. Pt states over the past week, she began having other symptoms including dependent edema, SOB, lightheadedness, and palpitations.

## 2018-09-30 ENCOUNTER — Observation Stay (HOSPITAL_BASED_OUTPATIENT_CLINIC_OR_DEPARTMENT_OTHER): Payer: Commercial Managed Care - PPO

## 2018-09-30 ENCOUNTER — Encounter (HOSPITAL_COMMUNITY): Payer: Self-pay

## 2018-09-30 DIAGNOSIS — I340 Nonrheumatic mitral (valve) insufficiency: Secondary | ICD-10-CM

## 2018-09-30 DIAGNOSIS — I959 Hypotension, unspecified: Secondary | ICD-10-CM | POA: Diagnosis not present

## 2018-09-30 DIAGNOSIS — R079 Chest pain, unspecified: Secondary | ICD-10-CM | POA: Diagnosis not present

## 2018-09-30 DIAGNOSIS — I361 Nonrheumatic tricuspid (valve) insufficiency: Secondary | ICD-10-CM

## 2018-09-30 LAB — BASIC METABOLIC PANEL
Anion gap: 6 (ref 5–15)
BUN: 11 mg/dL (ref 6–20)
CO2: 24 mmol/L (ref 22–32)
Calcium: 8.2 mg/dL — ABNORMAL LOW (ref 8.9–10.3)
Chloride: 107 mmol/L (ref 98–111)
Creatinine, Ser: 1.05 mg/dL — ABNORMAL HIGH (ref 0.44–1.00)
GFR calc Af Amer: >60 mL/min (ref >60)
GFR calc non Af Amer: >60 mL/min (ref >60)
Glucose, Bld: 91 mg/dL (ref 70–99)
Potassium: 3.8 mmol/L (ref 3.5–5.1)
Sodium: 137 mmol/L (ref 135–145)

## 2018-09-30 LAB — NM MYOCAR MULTI W/SPECT W/WALL MOTION / EF
Estimated workload: 9.1 METS
Exercise duration (min): 7 min
Exercise duration (sec): 9 s
LV dias vol: 47 mL (ref 46–106)
LV sys vol: 16 mL
MPHR: 155 {beats}/min
Peak HR: 155 {beats}/min
Percent HR: 90 %
RATE: 0.52
RPE: 17
Rest HR: 83 {beats}/min
SDS: 0
SRS: 0
SSS: 0
TID: 1.07

## 2018-09-30 LAB — ECHOCARDIOGRAM COMPLETE
Height: 64 in
Weight: 3169.6 oz

## 2018-09-30 LAB — CBC
HCT: 36.4 % (ref 36.0–46.0)
Hemoglobin: 11.4 g/dL — ABNORMAL LOW (ref 12.0–15.0)
MCH: 31.9 pg (ref 26.0–34.0)
MCHC: 31.3 g/dL (ref 30.0–36.0)
MCV: 102 fL — ABNORMAL HIGH (ref 80.0–100.0)
PLATELETS: 297 10*3/uL (ref 150–400)
RBC: 3.57 MIL/uL — AB (ref 3.87–5.11)
RDW: 11.9 % (ref 11.5–15.5)
WBC: 6.5 10*3/uL (ref 4.0–10.5)
nRBC: 0 % (ref 0.0–0.2)

## 2018-09-30 LAB — LIPID PANEL
Cholesterol: 194 mg/dL (ref 0–200)
HDL: 56 mg/dL (ref >40)
LDL Cholesterol: 107 mg/dL — ABNORMAL HIGH (ref 0–99)
Total CHOL/HDL Ratio: 3.5 RATIO
Triglycerides: 157 mg/dL — ABNORMAL HIGH (ref <150)
VLDL: 31 mg/dL (ref 0–40)

## 2018-09-30 LAB — HIV ANTIBODY (ROUTINE TESTING W REFLEX): HIV Screen 4th Generation wRfx: NONREACTIVE

## 2018-09-30 MED ORDER — TECHNETIUM TC 99M TETROFOSMIN IV KIT
10.0000 | PACK | Freq: Once | INTRAVENOUS | Status: AC | PRN
  Administered 2018-09-30: 10.4 via INTRAVENOUS

## 2018-09-30 MED ORDER — SODIUM CHLORIDE 0.9% FLUSH
INTRAVENOUS | Status: AC
  Administered 2018-09-30: 10 mL via INTRAVENOUS
  Filled 2018-09-30: qty 10

## 2018-09-30 MED ORDER — GUAIFENESIN ER 600 MG PO TB12: 600.0000 mg | ORAL_TABLET | Freq: Two times a day (BID) | ORAL | 0 refills | Status: AC

## 2018-09-30 MED ORDER — REGADENOSON 0.4 MG/5ML IV SOLN
INTRAVENOUS | Status: AC
  Filled 2018-09-30: qty 5

## 2018-09-30 MED ORDER — SODIUM CHLORIDE 0.9 % IV BOLUS: 500.0000 mL | Freq: Once | INTRAVENOUS | Status: DC

## 2018-09-30 MED ORDER — TECHNETIUM TC 99M TETROFOSMIN IV KIT
30.0000 | PACK | Freq: Once | INTRAVENOUS | Status: AC | PRN
  Administered 2018-09-30: 30 via INTRAVENOUS

## 2018-09-30 NOTE — Discharge Instructions (Signed)
1) may use over-the-counter Flonase nose spray 1 spray twice a day 2) please resume all your preadmission medications without changes 3) please follow-up with your regular primary care physician if further concerns about shortness of breath or chest discomfort

## 2018-09-30 NOTE — Progress Notes (Signed)
Echo shows normal LV function, no WMAs. Stress test shows no ischemia by imaging, exercised 7 minutes without ischemic EKG changes, low risk Duke treadmill score of 7. Cardiac workup is negative, consider alternative causes of her symptoms, no further cardiac testing is planned at this time   Dina Rich MD

## 2018-09-30 NOTE — Progress Notes (Signed)
*  PRELIMINARY RESULTS* Echocardiogram 2D Echocardiogram has been performed.  Stacey Drain 09/30/2018, 10:01 AM

## 2018-09-30 NOTE — Progress Notes (Signed)
Discharge instructions were reviewed and discussed with patient. All questions were answered and no further questions at this time. Pt in stable condition and in no acute distress at time of discharge. Pt escorted by RN.

## 2018-09-30 NOTE — Discharge Summary (Signed)
Kaylee Boyer, is a 48 y.o. female  DOB 07/18/1971  MRN [id number].  Admission date:  09/29/2018  Admitting Physician  Charlsie QuestVishal R Patel, MD  Discharge Date:  09/30/2018   Primary MD  Estanislado PandySasser, Paul W, MD  Recommendations for primary care physician for things to follow:   1) may use over-the-counter Flonase nose spray 1 spray twice a day 2) please resume all your preadmission medications without changes 3) please follow-up with your regular primary care physician if further concerns about shortness of breath or chest discomfort   Admission Diagnosis  Chest pain, unspecified type [R07.9]   Discharge Diagnosis  Chest pain, unspecified type [R07.9]    Principal Problem:   Chest pain Active Problems:   Hypothyroidism   Major depression   Bipolar disease, chronic (HCC)   Anxiety      Past Medical History:  Diagnosis Date  . Anxiety   . Bipolar disease, chronic (HCC)   . Hypothyroidism   . Major depression   . Thyroid disease     Past Surgical History:  Procedure Laterality Date  . CHOLECYSTECTOMY      6 yrs ago  . ESOPHAGOGASTRODUODENOSCOPY N/A 01/16/2016   Procedure: ESOPHAGOGASTRODUODENOSCOPY (EGD);  Surgeon: Malissa HippoNajeeb U Rehman, MD;  Location: [facility];  Service: Endoscopy;  Laterality: N/A;  10:30       HPI  from the history and physical done on the day of admission:   Chief Complaint: Chest pain  HPI: Kaylee Boyer is a 48 y.o. female with medical history significant for hypothyroidism, bipolar disorder, anxiety, major depression, and nightmares presents the ED with chest pain.  Patient reports intermittent episodes of pressure-like sternal chest pain which initially started about a month ago.  She has recently started exercising and noticed during activity she has developed this chest pain associated with palpitations, shortness of breath, fatigue, and lightheadedness without  syncope.  She had an episode of chest pain at rest within the last 2 days.  She says her chest pain generally is at the sternum but has had radiation to her neck and jaw with numbness.  Due to the continued symptoms, she called her PCP who recommended she present to the ED.  She is a never smoker, reports only occasional alcohol use about twice a month, and denies any illicit drug use.  She reports a history of atrial fibrillation in her mother but is not aware of any heart attack or coronary artery disease in immediate family members.  She [place] a recent [place] and noticed some swelling around her ankles.  She has no history of PE or DVT.  ED Course:  In the ED, initial vitals showed BP 106/50, pulse 84, RR 18, temp 98.7 Fahrenheit, SPO2 98% on room air.  Labs are notable for troponin I <0.03 x 2.  CBC and BMP are largely unremarkable.  EKG showed normal sinus rhythm with motion artifact and no acute ischemic changes.  2 view chest x-ray was without evidence of focal consolidation, effusion,  infiltrate.  CTA chest with contrast was negative for aortic dissection, aneurysm, atherosclerosis, or pulmonary embolus.  Patient was given aspirin 324 mg once, Nitropaste, and the hospitalist service was consulted to admit for chest pain rule out.    Hospital Course:       Kaylee Boyer is a 48 y.o. female with medical history significant for hypothyroidism, bipolar disorder, anxiety, major depression, and nightmares who is admitted for chest pain rule out.  Chest pain: --Ruled out for ACS by cardiac enzymes and EKG, cardiology consult appreciated, nuclear stress test reversible ischemia, gated images with preserved EF in the 65% range, and echocardiogram with preserved EF in the 65% range, no regional wall motion normalities,  Hypothyroidism: Last TSH 2.1 on 08/25/2018. -Continue Synthroid  Bipolar disorder/anxiety/depression: -Continue home Xanax, citalopram,  quetiapine  Discharge Condition: stable  Follow UP--- PCP as outpatient   Consults obtained - Cardiology  Diet and Activity recommendation:  As advised  Discharge Instructions    Discharge Instructions    Call MD for:  difficulty breathing, headache or visual disturbances   Complete by:  As directed    Call MD for:  persistant dizziness or light-headedness   Complete by:  As directed    Call MD for:  persistant nausea and vomiting   Complete by:  As directed    Call MD for:  redness, tenderness, or signs of infection (pain, swelling, redness, odor or green/yellow discharge around incision site)   Complete by:  As directed    Call MD for:  severe uncontrolled pain   Complete by:  As directed    Call MD for:  temperature >100.4   Complete by:  As directed    Diet - low sodium heart healthy   Complete by:  As directed    Discharge instructions   Complete by:  As directed    1) may use over-the-counter Flonase nose spray 1 spray twice a day 2) please resume all your preadmission medications without changes 3) please follow-up with your regular primary care physician if further concerns about shortness of breath or chest discomfort   Increase activity slowly   Complete by:  As directed         Discharge Medications     Allergies as of 09/30/2018      Reactions   Codeine Rash      Medication List    TAKE these medications   ALPRAZolam 1 MG tablet Commonly known as:  XANAX Take 2 mg by mouth at bedtime. *May take one tablet three times daily as needed for anxiety   bisacodyl 10 MG suppository Commonly known as:  DULCOLAX Use Dulcolax suppository as directed. What changed:    how much to take  how to take this  when to take this  reasons to take this  additional instructions   citalopram 40 MG tablet Commonly known as:  CELEXA Take 40 mg by mouth at bedtime.   dicyclomine 10 MG capsule Commonly known as:  Bentyl Take 1 capsule (10 mg total) by mouth 2  (two) times daily before a meal.   guaiFENesin 600 MG 12 hr tablet Commonly known as:  Mucinex Take 1 tablet (600 mg total) by mouth 2 (two) times daily for 10 days.   Mono-Linyah 0.25-35 MG-MCG tablet Generic drug:  norgestimate-ethinyl estradiol Take 1 tablet by mouth daily.   prazosin 1 MG capsule Commonly known as:  MINIPRESS Take 1 mg by mouth at bedtime.   QUEtiapine 200 MG tablet  Commonly known as:  SEROQUEL Take 200 mg by mouth at bedtime. 8 pm at night.   Synthroid 125 MCG tablet Generic drug:  levothyroxine Take 125 mcg by mouth every morning.       Major procedures and Radiology Reports - PLEASE review detailed and final reports for all details, in brief -   Dg Chest 2 View  Result Date: 09/29/2018 CLINICAL DATA:  Chest pain, heaviness on chest, sob, and dizziness on and off for a few weeks now. Pt has had a couple of episodes of feeling like she was going to pass out. Productive cough at times. Fatigue. cp x 1 month EXAM: CHEST - 2 VIEW COMPARISON:  None. FINDINGS: Normal mediastinum and cardiac silhouette. Normal pulmonary vasculature. No evidence of effusion, infiltrate, or pneumothorax. No acute bony abnormality. IMPRESSION: No acute cardiopulmonary process. Electronically Signed   By: Genevive Bi M.D.   On: 09/29/2018 10:31   Nm Myocar Multi W/spect W/wall Motion / Ef  Result Date: 09/30/2018  Blood pressure demonstrated a normal response to exercise.  There was no ST segment deviation noted during stress.  The study is normal. There are no perfusion defects  This is a low risk study.  The left ventricular ejection fraction is normal (55-65%).    Ct Angio Chest Aorta W And/or Wo Contrast  Result Date: 09/29/2018 CLINICAL DATA:  LT sided CP stating "it feels like an elephant is sitting on my chest" intermittently for the past month. Pt states over the past week, she began having other symptoms including dependent edema, SOB, lightheadedness, and  palpitations. No prior history. EXAM: CT ANGIOGRAPHY CHEST WITH CONTRAST TECHNIQUE: Multidetector CT imaging of the chest was performed using the standard protocol during bolus administration of intravenous contrast. Multiplanar CT image reconstructions and MIPs were obtained to evaluate the vascular anatomy. CONTRAST:  OMNIPAQUE IOHEXOL 350 MG/ML SOLN COMPARISON:  Current chest radiographs. FINDINGS: Cardiovascular: Thoracic aorta is normal in caliber. No dissection. No atherosclerosis. Aortic arch branch vessels are widely patent. Heart is normal in size and configuration. No pericardial effusion. No coronary artery calcifications. Pulmonary arteries are normal in caliber. No central pulmonary embolus. Mediastinum/Nodes: No neck base, axillary, mediastinal or hilar masses or enlarged lymph nodes. Normal trachea and esophagus. Lungs/Pleura: Minimal lung base and dependent lower lobe subsegmental atelectasis. Lungs are otherwise clear. No pleural effusion or pneumothorax. Upper Abdomen: Status post cholecystectomy.  Otherwise unremarkable. Musculoskeletal: No chest wall abnormality. No acute or significant osseous findings. Review of the MIP images confirms the above findings. IMPRESSION: 1. No aortic dissection, aneurysm or atherosclerosis. No central pulmonary embolus. 2. Normal exam other than minor subsegmental atelectasis in the lungs. Electronically Signed   By: Amie Portland M.D.   On: 09/29/2018 16:27    Today   Subjective    Kelechi Truly today has no new complaints, husband at bedside, questions answered          Patient has been seen and examined prior to discharge   Objective   Blood pressure (!) 90/58, pulse 65, temperature 98.4 F (36.9 C), temperature source Oral, resp. rate 15, height 5\' 4"  (1.626 m), weight 89.9 kg, SpO2 96 %.   Intake/Output Summary (Last 24 hours) at 09/30/2018 1540 Last data filed at 09/30/2018 1047 Gross per 24 hour  Intake -  Output 600 ml  Net -600  ml    Exam Gen:- Awake Alert, no acute distress  HEENT:- [place].AT, No sclera icterus Neck-Supple Neck,No JVD,.  Lungs-  CTAB ,  good air movement bilaterally  CV- S1, S2 normal, regular Abd-  +ve B.Sounds, Abd Soft, No tenderness,    Extremity/Skin:- No  edema,   good pulses Psych-affect is appropriate, oriented x3 Neuro-no new focal deficits, no tremors    Data Review   CBC w Diff:  Lab Results  Component Value Date   WBC 6.5 09/30/2018   HGB 11.4 (L) 09/30/2018   HCT 36.4 09/30/2018   PLT 297 09/30/2018   LYMPHOPCT 30 01/16/2016   MONOPCT 5 01/16/2016   EOSPCT 1 01/16/2016   BASOPCT 0 01/16/2016    CMP:  Lab Results  Component Value Date   NA 137 09/30/2018   K 3.8 09/30/2018   CL 107 09/30/2018   CO2 24 09/30/2018   BUN 11 09/30/2018   CREATININE 1.05 (H) 09/30/2018   PROT 6.2 (L) 01/16/2016   ALBUMIN 3.1 (L) 01/16/2016   BILITOT 0.4 01/16/2016   ALKPHOS 51 01/16/2016   AST 11 (L) 01/16/2016   ALT 7 (L) 01/16/2016  .   Total Discharge time is about 33 minutes  Shon Hale M.D on 09/30/2018 at 3:40 PM  Go to [email or web address] -  for contact info  [facility] - Office  [phone]

## 2018-09-30 NOTE — Consult Note (Signed)
Cardiology Consultation:   Patient ID: Kaylee Boyer MRN: [id number]; DOB: 1970/12/20  Admit date: 09/29/2018 Date of Consult: 09/30/2018  Primary Care Provider: Estanislado Pandy, MD Primary Cardiologist: Howell Rucks Primary Electrophysiologist:  None   Patient Profile:   Kaylee Boyer is a 48 y.o. female with no known prior cardiac history who is being seen today for the evaluation of chest pain at the request of Dr Allena Katz.  History of Present Illness:   Ms. Kaylee Boyer 48 yo female history of bipolar, hypothyroidism,  admitted with chest pain. Symptoms started about 1 month ago. Pressure mid to left chest, at worst 8/10 . Can feel like elephant on her chest. Can occur at rest or with exertion, associated SOB. Not worst with position, not associated with meals. Variable in duration, sometimes few minutes, sometimes can last several hours. Noted chest pain and fatigue at gym twice this week, new for her. Pain can radiate into left jaw. Mildly improved with NG   WBC 5.9 Hgb 12.2 Plt 328 K 3.9 Cr 1.08  Trop neg-->neg-->neg-->neg CXR no acute process CTA Chest: no acute aortic pathology, no central PE EKG SR, no ischemic changes.  Past Medical History:  Diagnosis Date  . Anxiety   . Bipolar disease, chronic (HCC)   . Hypothyroidism   . Major depression   . Thyroid disease     Past Surgical History:  Procedure Laterality Date  . CHOLECYSTECTOMY      6 yrs ago  . ESOPHAGOGASTRODUODENOSCOPY N/A 01/16/2016   Procedure: ESOPHAGOGASTRODUODENOSCOPY (EGD);  Surgeon: Malissa Hippo, MD;  [facility];  Service: Endoscopy;  Laterality: N/A;  10:30     Inpatient Medications: Scheduled Meds: . ALPRAZolam  2 mg Oral QHS  . aspirin EC  325 mg Oral Daily  . citalopram  40 mg Oral QHS  . dicyclomine  10 mg Oral BID AC  . enoxaparin (LOVENOX) injection  40 mg Subcutaneous Q24H  . levothyroxine  125 mcg Oral Q0600  . nitroGLYCERIN  1 inch Topical Q6H  . prazosin  1 mg  Oral QHS  . QUEtiapine  200 mg Oral Q24H   Continuous Infusions:  PRN Meds: acetaminophen, bisacodyl, ondansetron (ZOFRAN) IV  Allergies:    Allergies  Allergen Reactions  . Codeine Rash    Social History:   Social History   Socioeconomic History  . Marital status: Married    Spouse name: Not on file  . Number of children: Not on file  . Years of education: Not on file  . Highest education level: Not on file  Occupational History  . Not on file  Social Needs  . Financial resource strain: Not on file  . Food insecurity:    Worry: Not on file    Inability: Not on file  . Transportation needs:    Medical: Not on file    Non-medical: Not on file  Tobacco Use  . Smoking status: Never Smoker  . Smokeless tobacco: Never Used  Substance and Sexual Activity  . Alcohol use: Yes    Alcohol/week: 0.0 standard drinks    Comment: Occasional ~ 2x/month  . Drug use: No  . Sexual activity: Not on file  Lifestyle  . Physical activity:    Days per week: Not on file    Minutes per session: Not on file  . Stress: Not on file  Relationships  . Social connections:    Talks on phone: Not on file    Gets together: Not on  file    Attends religious service: Not on file    Active member of club or organization: Not on file    Attends meetings of clubs or organizations: Not on file    Relationship status: Not on file  . Intimate partner violence:    Fear of current or ex partner: Not on file    Emotionally abused: Not on file    Physically abused: Not on file    Forced sexual activity: Not on file  Other Topics Concern  . Not on file  Social History Narrative  . Not on file    Family History:    Family History  Problem Relation Age of Onset  . Atrial fibrillation Mother   . Myelodysplastic syndrome Father      ROS:  Please see the history of present illness.  All other ROS reviewed and negative.     Physical Exam/Data:   Vitals:   09/29/18 2202 09/29/18 2203  09/30/18 0216 09/30/18 0600  BP: (!) 96/55 (!) 97/57 (!) 84/47 (!) 90/58  Pulse: 68  68 65  Resp: 16  15 15   Temp: 98.1 F (36.7 C)  98.1 F (36.7 C) 98.4 F (36.9 C)  TempSrc: Oral  Oral Oral  SpO2: 98%  95% 96%  Weight:      Height:       No intake or output data in the 24 hours ending 09/30/18 0825 Last 3 Weights 09/29/2018 09/29/2018 08/29/2018  Weight (lbs) 198 lb 1.6 oz 194 lb 194 lb 11.2 oz  Weight (kg) 89.858 kg 87.998 kg 88.315 kg     Body mass index is 34 kg/m.  General:  Well nourished, well developed, in no acute distress HEENT: normal Lymph: no adenopathy Neck: no JVD Endocrine:  No thryomegaly Cardiac:  normal S1, S2; RRR; no murmur  Lungs:  clear to auscultation bilaterally, no wheezing, rhonchi or rales  Abd: soft, nontender, no hepatomegaly  Ext: no edema Musculoskeletal:  No deformities, BUE and BLE strength normal and equal Skin: warm and dry  Neuro:  CNs 2-12 intact, no focal abnormalities noted Psych:  Normal affect    Laboratory Data:  Chemistry Recent Labs  Lab 09/29/18 1054 09/30/18 0540  NA 137 137  K 3.9 3.8  CL 106 107  CO2 23 24  GLUCOSE 102* 91  BUN 12 11  CREATININE 1.08* 1.05*  CALCIUM 8.5* 8.2*  GFRNONAA >60 >60  GFRAA >60 >60  ANIONGAP 8 6    No results for input(s): PROT, ALBUMIN, AST, ALT, ALKPHOS, BILITOT in the last 168 hours. Hematology Recent Labs  Lab 09/29/18 1054 09/30/18 0540  WBC 5.9 6.5  RBC 3.87 3.57*  HGB 12.2 11.4*  HCT 39.4 36.4  MCV 101.8* 102.0*  MCH 31.5 31.9  MCHC 31.0 31.3  RDW 11.9 11.9  PLT 328 297   Cardiac Enzymes Recent Labs  Lab 09/29/18 1054 09/29/18 1418 09/29/18 2003  TROPONINI <0.03 <0.03 <0.03    Recent Labs  Lab 09/29/18 1238  TROPIPOC 0.01    BNPNo results for input(s): BNP, PROBNP in the last 168 hours.  DDimer No results for input(s): DDIMER in the last 168 hours.  Radiology/Studies:  Dg Chest 2 View  Result Date: 09/29/2018 CLINICAL DATA:  Chest pain, heaviness on  chest, sob, and dizziness on and off for a few weeks now. Pt has had a couple of episodes of feeling like she was going to pass out. Productive cough at times. Fatigue. cp x 1 month  EXAM: CHEST - 2 VIEW COMPARISON:  None. FINDINGS: Normal mediastinum and cardiac silhouette. Normal pulmonary vasculature. No evidence of effusion, infiltrate, or pneumothorax. No acute bony abnormality. IMPRESSION: No acute cardiopulmonary process. Electronically Signed   By: Genevive BiStewart  Edmunds M.D.   On: 09/29/2018 10:31   Ct Angio Chest Aorta W And/or Wo Contrast  Result Date: 09/29/2018 CLINICAL DATA:  LT sided CP stating "it feels like an elephant is sitting on my chest" intermittently for the past month. Pt states over the past week, she began having other symptoms including dependent edema, SOB, lightheadedness, and palpitations. No prior history. EXAM: CT ANGIOGRAPHY CHEST WITH CONTRAST TECHNIQUE: Multidetector CT imaging of the chest was performed using the standard protocol during bolus administration of intravenous contrast. Multiplanar CT image reconstructions and MIPs were obtained to evaluate the vascular anatomy. CONTRAST:  100mL OMNIPAQUE IOHEXOL 350 MG/ML SOLN COMPARISON:  Current chest radiographs. FINDINGS: Cardiovascular: Thoracic aorta is normal in caliber. No dissection. No atherosclerosis. Aortic arch branch vessels are widely patent. Heart is normal in size and configuration. No pericardial effusion. No coronary artery calcifications. Pulmonary arteries are normal in caliber. No central pulmonary embolus. Mediastinum/Nodes: No neck base, axillary, mediastinal or hilar masses or enlarged lymph nodes. Normal trachea and esophagus. Lungs/Pleura: Minimal lung base and dependent lower lobe subsegmental atelectasis. Lungs are otherwise clear. No pleural effusion or pneumothorax. Upper Abdomen: Status post cholecystectomy.  Otherwise unremarkable. Musculoskeletal: No chest wall abnormality. No acute or significant  osseous findings. Review of the MIP images confirms the above findings. IMPRESSION: 1. No aortic dissection, aneurysm or atherosclerosis. No central pulmonary embolus. 2. Normal exam other than minor subsegmental atelectasis in the lungs. Electronically Signed   By: Amie Portlandavid  Ormond M.D.   On: 09/29/2018 16:27    Assessment and Plan:   1. Chest pain - mixed symptoms, some aspects are worrisome for possible ischemia - EKG and enzymes have been benign - due to degree and progression of symptoms will plan for exercise nuclear stress today, also plan for echo.   2. Hypotension - likely from nitro paste, we will bolus 500mL NS. Remove nitropaste.    For questions or updates, please contact [facility] Please consult [email or web address] for contact info under     Signed, Dina RichBranch, Jonathan, MD  09/30/2018 8:25 AM

## 2018-10-19 ENCOUNTER — Telehealth (INDEPENDENT_AMBULATORY_CARE_PROVIDER_SITE_OTHER): Payer: Self-pay | Admitting: Internal Medicine

## 2018-10-19 NOTE — Telephone Encounter (Signed)
Okay 

## 2018-10-19 NOTE — Telephone Encounter (Signed)
Patient called stated the medicine is working and she is interested in doing a virtual office visit on 3/30

## 2018-10-24 ENCOUNTER — Ambulatory Visit (INDEPENDENT_AMBULATORY_CARE_PROVIDER_SITE_OTHER): Payer: Commercial Managed Care - PPO | Admitting: Internal Medicine

## 2018-10-24 ENCOUNTER — Encounter (INDEPENDENT_AMBULATORY_CARE_PROVIDER_SITE_OTHER): Payer: Self-pay | Admitting: Internal Medicine

## 2018-10-24 ENCOUNTER — Ambulatory Visit (INDEPENDENT_AMBULATORY_CARE_PROVIDER_SITE_OTHER): Payer: Self-pay | Admitting: Internal Medicine

## 2018-10-24 DIAGNOSIS — K580 Irritable bowel syndrome with diarrhea: Secondary | ICD-10-CM

## 2018-10-24 NOTE — Patient Instructions (Signed)
Continue the Dicyclomine BID Continue Fiber. OV in 6 months

## 2018-10-24 NOTE — Progress Notes (Addendum)
   Subjective:  Dr. Neita Carp.  Has not been out of country.  Patient agrees to speak to me for OV. She is agreeable to see me. Unable to do video OV. OV due to the COVID-19.    Patient ID: Kaylee Boyer, female    DOB: 12/24/1970, 48 y.o.   MRN: [id number] Time 10: 15am-10:30am. Total time 15 minutes.  PCP Ms. Linus Salmons FNP at [facility].  HPI This is a telephone OV. Patient is at home. Telephone OV due to COVID-19.  I am in office. Patient is agreeable to this visit.  She was last seen 08/29/2018. Seen for bowel changes by Dr. Karilyn Cota.  Had been using Ex-Lax. She may would go 5 days without a BM. Recently has noted postprandial urgency diarrhea as well as explosive bowel movements. Stool consistency had change, but no melena or BRRB. When she had these episodes, she became diaphoretic. Has lost 20 pounds in the past year. She was started on Fiberchoice 3gm daily and dicyclomine 10 mg BID.  She was advised to take Dulcolax when she is unable to have a BM .  She tells me she is doing. She has had 1-2 instances of diarrhea, but she is much better. Occurred when she ate some greasy. Her appetite is good. No weight loss.  She is having 1 BM a day. The Dicyclomine has helped. Her appetite is good. She is trying avoid greasy foods.  She is 100% better.     Review of Systems     Past Medical History:  Diagnosis Date  . Anxiety   . Bipolar disease, chronic (HCC)   . Hypothyroidism   . Major depression   . Thyroid disease     Past Surgical History:  Procedure Laterality Date  . CHOLECYSTECTOMY      6 yrs ago  . ESOPHAGOGASTRODUODENOSCOPY N/A 01/16/2016   Procedure: ESOPHAGOGASTRODUODENOSCOPY (EGD);  Surgeon: Malissa Hippo, MD;  [facility];  Service: Endoscopy;  Laterality: N/A;  10:30    Allergies  Allergen Reactions  . Codeine Rash    Current Outpatient Medications on File Prior to Visit  Medication Sig Dispense Refill  . bisacodyl (DULCOLAX) 10  MG suppository Use Dulcolax suppository as directed. (Patient taking differently: Place 10 mg rectally daily as needed for mild constipation or moderate constipation. ) 12 suppository 0  . citalopram (CELEXA) 40 MG tablet Take 40 mg by mouth at bedtime.    . dicyclomine (BENTYL) 10 MG capsule Take 1 capsule (10 mg total) by mouth 2 (two) times daily before a meal. 60 capsule 5  . LORazepam (ATIVAN) 1 MG tablet Take 1 mg by mouth every 8 (eight) hours.    . norgestimate-ethinyl estradiol (MONO-LINYAH) 0.25-35 MG-MCG tablet Take 1 tablet by mouth daily.     . QUEtiapine (SEROQUEL) 200 MG tablet Take 200 mg by mouth at bedtime. 8 pm at night.    Marland Kitchen SYNTHROID 125 MCG tablet Take 125 mcg by mouth every morning.   11  . Vilazodone HCl (VIIBRYD) 20 MG TABS Take by mouth.     No current facility-administered medications on file prior to visit.      Objective:   Physical Exam Deferred        Assessment & Plan:  IBS. Continue the Dicyclomine. Lots of fiber. OV in 6 months.

## 2018-10-25 ENCOUNTER — Ambulatory Visit (INDEPENDENT_AMBULATORY_CARE_PROVIDER_SITE_OTHER): Payer: Self-pay | Admitting: Internal Medicine

## 2019-04-26 ENCOUNTER — Ambulatory Visit (INDEPENDENT_AMBULATORY_CARE_PROVIDER_SITE_OTHER): Payer: Commercial Managed Care - PPO | Admitting: Nurse Practitioner

## 2019-07-31 ENCOUNTER — Other Ambulatory Visit: Payer: Self-pay

## 2019-07-31 ENCOUNTER — Ambulatory Visit (INDEPENDENT_AMBULATORY_CARE_PROVIDER_SITE_OTHER): Payer: Commercial Managed Care - PPO | Admitting: Podiatry

## 2019-07-31 ENCOUNTER — Ambulatory Visit (INDEPENDENT_AMBULATORY_CARE_PROVIDER_SITE_OTHER): Payer: Commercial Managed Care - PPO

## 2019-07-31 DIAGNOSIS — M2580 Other specified joint disorders, unspecified joint: Secondary | ICD-10-CM | POA: Diagnosis not present

## 2019-07-31 DIAGNOSIS — M21622 Bunionette of left foot: Secondary | ICD-10-CM

## 2019-07-31 NOTE — Patient Instructions (Signed)
Pre-Operative Instructions  Congratulations, you have decided to take an important step towards improving your quality of life.  You can be assured that the doctors and staff at [facility] will be with you every step of the way.  Here are some important things you should know:  1. Plan to be at the surgery center/hospital at least 1 (one) hour prior to your scheduled time, unless otherwise directed by the surgical center/hospital staff.  You must have a responsible adult accompany you, remain during the surgery and drive you home.  Make sure you have directions to the surgical center/hospital to ensure you arrive on time. 2. If you are having surgery at [facility] or [facility] hospitals, you will need a copy of your medical history and physical form from your family physician within one month prior to the date of surgery. We will give you a form for your primary physician to complete.  3. We make every effort to accommodate the date you request for surgery.  However, there are times where surgery dates or times have to be moved.  We will contact you as soon as possible if a change in schedule is required.   4. No aspirin/ibuprofen for one week before surgery.  If you are on aspirin, any non-steroidal anti-inflammatory medications (Mobic, Aleve, Ibuprofen) should not be taken seven (7) days prior to your surgery.  You make take Tylenol for pain prior to surgery.  5. Medications - If you are taking daily heart and blood pressure medications, seizure, reflux, allergy, asthma, anxiety, pain or diabetes medications, make sure you notify the surgery center/hospital before the day of surgery so they can tell you which medications you should take or avoid the day of surgery. 6. No food or drink after midnight the night before surgery unless directed otherwise by surgical center/hospital staff. 7. No alcoholic beverages 24-hours prior to surgery.  No smoking 24-hours prior or 24-hours after  surgery. 8. Wear loose pants or shorts. They should be loose enough to fit over bandages, boots, and casts. 9. Don't wear slip-on shoes. Sneakers are preferred. 10. Bring your boot with you to the surgery center/hospital.  Also bring crutches or a walker if your physician has prescribed it for you.  If you do not have this equipment, it will be provided for you after surgery. 11. If you have not been contacted by the surgery center/hospital by the day before your surgery, call to confirm the date and time of your surgery. 12. Leave-time from work may vary depending on the type of surgery you have.  Appropriate arrangements should be made prior to surgery with your employer. 13. Prescriptions will be provided immediately following surgery by your doctor.  Fill these as soon as possible after surgery and take the medication as directed. Pain medications will not be refilled on weekends and must be approved by the doctor. 14. Remove nail polish on the operative foot and avoid getting pedicures prior to surgery. 15. Wash the night before surgery.  The night before surgery wash the foot and leg well with water and the antibacterial soap provided. Be sure to pay special attention to beneath the toenails and in between the toes.  Wash for at least three (3) minutes. Rinse thoroughly with water and dry well with a towel.  Perform this wash unless told not to do so by your physician.  Enclosed: 1 Ice pack (please put in freezer the night before surgery)   1 Hibiclens skin cleaner     Pre-op instructions  If you have any questions regarding the instructions, please do not hesitate to call our office.  [place]: [address], [place], [place] [address] -- [phone]  [place]: [address] [place], [place] [address] -- [phone]  [place]: [address], [place],  [address] -- [phone]   Website: [email or web address] 

## 2019-08-03 NOTE — Progress Notes (Signed)
   Subjective: 49 y.o. female presenting today as a new patient, referred by Dr. Elijah Birk, with a chief complaint of throbbing, stabbing pain of the medial and lateral left foot that began about four weeks ago. She reports associated swelling and states it feels like she is walking on marbles. She has been using a CAM boot and has received injections for treatment with no significant relief. Patient is here for further evaluation and treatment.   Past Medical History:  Diagnosis Date  . Anxiety   . Bipolar disease, chronic (HCC)   . Hypothyroidism   . Major depression   . Thyroid disease      Objective: Physical Exam General: The patient is alert and oriented x3 in no acute distress.  Dermatology: Skin is cool, dry and supple bilateral lower extremities. Negative for open lesions or macerations.  Vascular: Palpable pedal pulses bilaterally. No edema or erythema noted. Capillary refill within normal limits.  Neurological: Epicritic and protective threshold grossly intact bilaterally.   Musculoskeletal Exam: Clinical evidence of Tailor's bunion deformity noted to the respective foot. There is a moderate pain on palpation range of motion of the fifth MPJ.  Pain on palpation with limited range of motion noted to the first MPJ left foot.  Radiographic Exam: Degenerative changes noted with joint space narrowing first MPJ. There also appears to be extra-articular spurring noted about the joint.  Increased intermetatarsal angle to the fourth interspace of the respective foot. Prominent fifth metatarsal head. Joint spaces preserved.   Assessment: 1. Tailor's bunion deformity left 2. Tibial sesamoiditis left    Plan of Care:  1. Patient was evaluated. X-Rays reviewed.  2. Today we discussed the conservative versus surgical management of the presenting pathology. The patient opts for surgical management. All possible complications and details of the procedure were explained. All patient  questions were answered. No guarantees were expressed or implied. 3. Authorization for surgery was initiated today. Surgery will consist of Tailor's bunionectomy left; tibial sesamoidectomy left.  4. Return to clinic one week post op.   Husband does water meters for [employer].       Felecia Shelling, DPM [facility]  Dr. Felecia Shelling, DPM    [address]                                        [place], [place] [address]                Office [phone]  Fax [phone]

## 2019-09-19 ENCOUNTER — Telehealth: Payer: Self-pay

## 2019-09-19 NOTE — Telephone Encounter (Addendum)
DOS 10/12/19 METATARSAL OSTEOTOMY 5TH LT - 28308 SESAMOIDECTOMY LT - 28315  [facility] EFFECTIVE DATE - 07/28/19  PLAN DEDUCTIBLE- $1500 W/ $0.00 REMAINING  OUT OF POCKET - $4000 W/ $9518.84 REMAINING  COPAY - $0.00  COINSURANCE - 20%  09/22/2019 - Spoke to Riza at  [facility] (ref# [id number]) procedure codes 09323 & 28315 require authorization. Faxed clinicals to [phone] ref# [id number]

## 2019-10-04 NOTE — Telephone Encounter (Signed)
DOS 10/12/19 METATARSAL OSTEOTOMY 5TH LT - 28308 SESAMOIDECTOMY LT - 28315  [facility] EFFECTIVE DATE - 07/28/19  PLAN DEDUCTIBLE- $1500 W/ $0.00 REMAINING  OUT OF POCKET - $4000 W/ $3710.62 REMAINING  COPAY - $0.00  COINSURANCE - 20%  09/22/2019 - Spoke to Riza at  [facility] (ref# [id number]) procedure codes 50093 & 28315 require authorization. Faxed clinicals to [phone] ref# [phone]  Per Judy Pimple Auth# [id number] is approved and valid from  09/25/2019 - 10/26/2019.

## 2019-10-12 ENCOUNTER — Encounter: Payer: Self-pay | Admitting: Podiatry

## 2019-10-12 ENCOUNTER — Other Ambulatory Visit: Payer: Self-pay | Admitting: Podiatry

## 2019-10-12 DIAGNOSIS — M84872 Other disorders of continuity of bone, left ankle and foot: Secondary | ICD-10-CM | POA: Diagnosis not present

## 2019-10-12 DIAGNOSIS — M21542 Acquired clubfoot, left foot: Secondary | ICD-10-CM | POA: Diagnosis not present

## 2019-10-12 MED ORDER — HYDROMORPHONE HCL 4 MG PO TABS: 4.0000 mg | ORAL_TABLET | ORAL | 0 refills | Status: DC | PRN

## 2019-10-12 NOTE — Progress Notes (Signed)
PRN postop 

## 2019-10-18 ENCOUNTER — Ambulatory Visit (INDEPENDENT_AMBULATORY_CARE_PROVIDER_SITE_OTHER): Payer: Commercial Managed Care - PPO

## 2019-10-18 ENCOUNTER — Other Ambulatory Visit: Payer: Self-pay

## 2019-10-18 ENCOUNTER — Ambulatory Visit (INDEPENDENT_AMBULATORY_CARE_PROVIDER_SITE_OTHER): Payer: Commercial Managed Care - PPO | Admitting: Podiatry

## 2019-10-18 DIAGNOSIS — M21622 Bunionette of left foot: Secondary | ICD-10-CM | POA: Diagnosis not present

## 2019-10-18 DIAGNOSIS — M2580 Other specified joint disorders, unspecified joint: Secondary | ICD-10-CM

## 2019-10-18 DIAGNOSIS — Z9889 Other specified postprocedural states: Secondary | ICD-10-CM

## 2019-10-18 MED ORDER — HYDROMORPHONE HCL 4 MG PO TABS: 4.0000 mg | ORAL_TABLET | ORAL | 0 refills | Status: DC | PRN

## 2019-10-22 NOTE — Progress Notes (Signed)
   Subjective:  Patient presents today status post Tailor's bunionectomy and tibial sesamoidectomy left. DOS: 10/12/2019. She reports constant numbness of the great toe. She has been using the CAM boot and taking the pain medication as directed. There are no modifying factors noted. Patient is here for further evaluation and treatment.    Past Medical History:  Diagnosis Date  . Anxiety   . Bipolar disease, chronic (HCC)   . Hypothyroidism   . Major depression   . Thyroid disease       Objective/Physical Exam Neurovascular status intact.  Skin incisions appear to be well coapted with sutures and staples intact. No sign of infectious process noted. No dehiscence. No active bleeding noted. Moderate edema noted to the surgical extremity.  Radiographic Exam:  Orthopedic hardware and osteotomies sites appear to be stable with routine healing.  Assessment: 1. s/p Tailor's bunionectomy and tibial sesamoidectomy left. DOS: 10/12/2019   Plan of Care:  1. Patient was evaluated. X-rays reviewed 2. Dressing changed.  3. Ace wraps provided to patient.  4. Continue weightbearing in CAM boot.  5. Refill prescription for Percocet 5/325 mg provided to patient.  6. Return to clinic in one week for suture removal.   Husband does water meters for [employer]. His name is Minerva Areola.    Felecia Shelling, DPM [facility]  Dr. Felecia Shelling, DPM    [address]                                        [place], [place] [address]                Office [phone]  Fax [phone]

## 2019-10-25 ENCOUNTER — Other Ambulatory Visit: Payer: Self-pay

## 2019-10-25 ENCOUNTER — Ambulatory Visit (INDEPENDENT_AMBULATORY_CARE_PROVIDER_SITE_OTHER): Payer: Commercial Managed Care - PPO | Admitting: Podiatry

## 2019-10-25 DIAGNOSIS — M2580 Other specified joint disorders, unspecified joint: Secondary | ICD-10-CM

## 2019-10-25 DIAGNOSIS — M21622 Bunionette of left foot: Secondary | ICD-10-CM

## 2019-10-25 DIAGNOSIS — Z9889 Other specified postprocedural states: Secondary | ICD-10-CM

## 2019-10-25 NOTE — Patient Instructions (Signed)
-   Stay in postop surgical shoe for 2 weeks - Leave dressing on until Sunday (Easter). After that you may get it wet and dress it with antibiotic ointment and a large bandaid.

## 2019-11-01 NOTE — Progress Notes (Signed)
   Subjective:  Patient presents today status post Tailor's bunionectomy and tibial sesamoidectomy left. DOS: 10/12/2019. She denies any pain or modifying factors. She has been using the CAM boot as directed. Patient is here for further evaluation and treatment.    Past Medical History:  Diagnosis Date  . Anxiety   . Bipolar disease, chronic (HCC)   . Hypothyroidism   . Major depression   . Thyroid disease       Objective/Physical Exam Neurovascular status intact.  Skin incisions appear to be well coapted with sutures and staples intact. No sign of infectious process noted. No dehiscence. No active bleeding noted. Moderate edema noted to the surgical extremity.  Assessment: 1. s/p Tailor's bunionectomy and tibial sesamoidectomy left. DOS: 10/12/2019   Plan of Care:  1. Patient was evaluated. 2. Sutures removed.  3. Discontinue using CAM boot.  4. Post op shoe dispensed.  5. Return to clinic in 2 weeks.    Husband does water meters for [employer]. His name is Minerva Areola.    Felecia Shelling, DPM [facility]  Dr. Felecia Shelling, DPM    [address]                                        [place], [place] [address]                Office [phone]  Fax [phone]

## 2019-11-08 ENCOUNTER — Ambulatory Visit (INDEPENDENT_AMBULATORY_CARE_PROVIDER_SITE_OTHER): Payer: Commercial Managed Care - PPO | Admitting: Podiatry

## 2019-11-08 ENCOUNTER — Other Ambulatory Visit: Payer: Self-pay

## 2019-11-08 ENCOUNTER — Ambulatory Visit (INDEPENDENT_AMBULATORY_CARE_PROVIDER_SITE_OTHER): Payer: Commercial Managed Care - PPO

## 2019-11-08 VITALS — Temp 98.2°F

## 2019-11-08 DIAGNOSIS — M21622 Bunionette of left foot: Secondary | ICD-10-CM

## 2019-11-08 DIAGNOSIS — Z9889 Other specified postprocedural states: Secondary | ICD-10-CM

## 2019-11-08 DIAGNOSIS — M2580 Other specified joint disorders, unspecified joint: Secondary | ICD-10-CM

## 2019-11-15 NOTE — Progress Notes (Signed)
   Subjective:  Patient presents today status post Tailor's bunionectomy and tibial sesamoidectomy left. DOS: 10/12/2019. She states she is doing well overall. She reports some purplish-reddish discoloration with standing. She reports associated swelling that is caused by standing. She has been using the CAM boot. Patient is here for further evaluation and treatment.   Past Medical History:  Diagnosis Date  . Anxiety   . Bipolar disease, chronic (HCC)   . Hypothyroidism   . Major depression   . Thyroid disease       Objective/Physical Exam Neurovascular status intact.  Skin incisions appear to be well coapted. No sign of infectious process noted. No dehiscence. No active bleeding noted. Moderate edema noted to the surgical extremity.  Assessment: 1. s/p Tailor's bunionectomy and tibial sesamoidectomy left. DOS: 10/12/2019   Plan of Care:  1. Patient was evaluated. 2. Discontinue using CAM boot.  3. Recommended good shoe gear.  4. Compression anklet dispensed.  5. Return to clinic in 4 weeks.    Husband does water meters for [employer]. His name is Minerva Areola.    Felecia Shelling, DPM [facility]  Dr. Felecia Shelling, DPM    [address]                                        [place], [place] [address]                Office [phone]  Fax [phone]

## 2019-12-06 ENCOUNTER — Ambulatory Visit (INDEPENDENT_AMBULATORY_CARE_PROVIDER_SITE_OTHER): Payer: Commercial Managed Care - PPO | Admitting: Podiatry

## 2019-12-06 ENCOUNTER — Ambulatory Visit (INDEPENDENT_AMBULATORY_CARE_PROVIDER_SITE_OTHER): Payer: Commercial Managed Care - PPO

## 2019-12-06 ENCOUNTER — Other Ambulatory Visit: Payer: Self-pay

## 2019-12-06 DIAGNOSIS — Z9889 Other specified postprocedural states: Secondary | ICD-10-CM

## 2019-12-06 DIAGNOSIS — M21622 Bunionette of left foot: Secondary | ICD-10-CM

## 2019-12-06 DIAGNOSIS — M2580 Other specified joint disorders, unspecified joint: Secondary | ICD-10-CM

## 2019-12-06 MED ORDER — MELOXICAM 15 MG PO TABS: 15.0000 mg | ORAL_TABLET | Freq: Every day | ORAL | 1 refills | Status: DC

## 2019-12-10 IMAGING — CT CT ANGIO CHEST
4 of 7 series · 18 of 46 positions shown · IV contrast (Isovue)
Comparison: Current chest radiographs.

CLINICAL DATA: LT sided CP stating "it feels like an elephant is
sitting on my chest" intermittently for the past month. Pt states
over the past week, she began having other symptoms including
dependent edema, SOB, lightheadedness, and palpitations. No prior
history.

EXAM:
CT ANGIOGRAPHY CHEST WITH CONTRAST
TECHNIQUE: Multidetector CT imaging of the chest was performed using the
standard protocol during bolus administration of intravenous
contrast. Multiplanar CT image reconstructions and MIPs were
obtained to evaluate the vascular anatomy.
CONTRAST:  100mL OMNIPAQUE IOHEXOL 350 MG/ML SOLN

[Series 5: axial arterial · axial · arterial · 0.73mm/px · z∈[+1177,+1408]mm · 9 of 97 slices shown]
[im 10/97  lung]
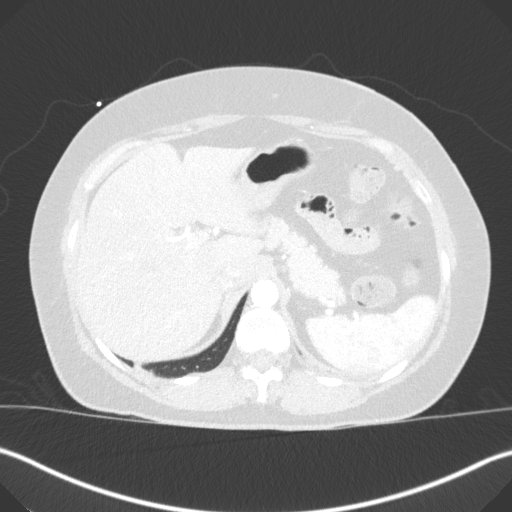
[im 20/97  soft-tissue]
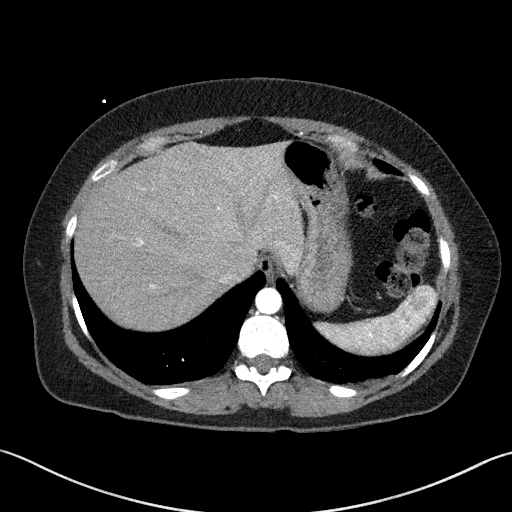
[im 29/97  lung]
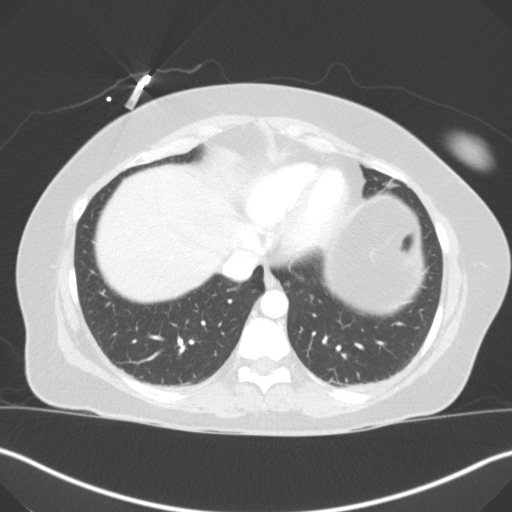
[im 39/97  soft-tissue]
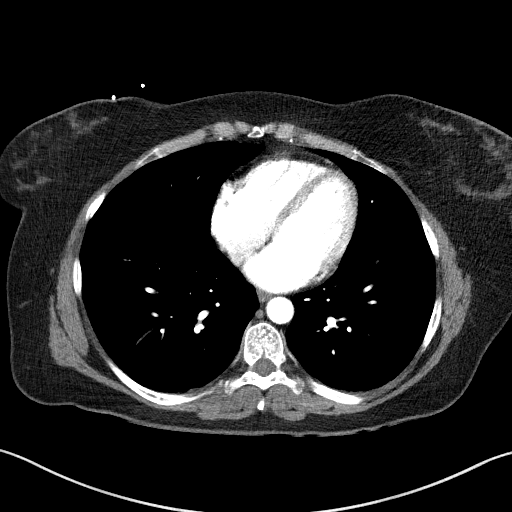
[im 49/97  lung]
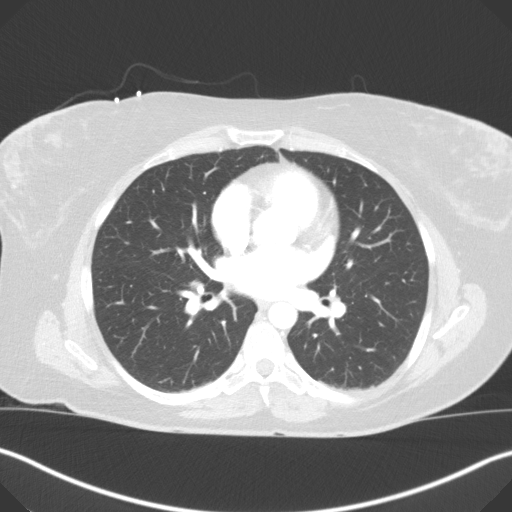
[im 58/97  soft-tissue]
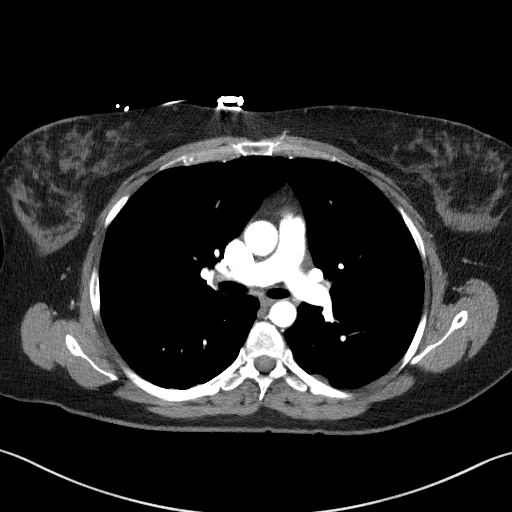
[im 68/97  lung]
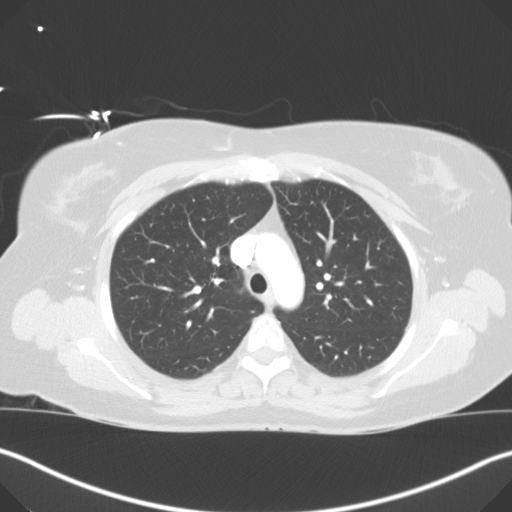
[im 77/97  soft-tissue]
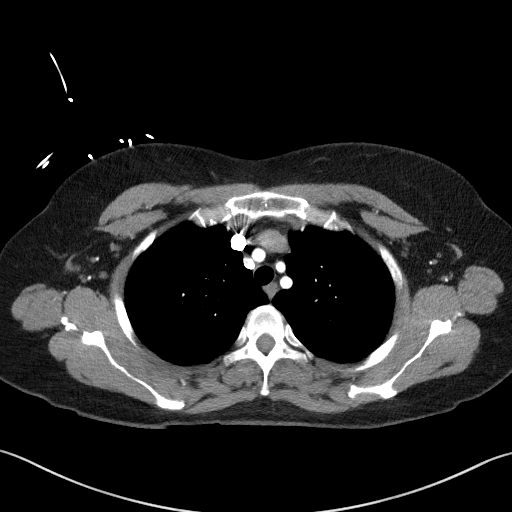
[im 87/97  lung]
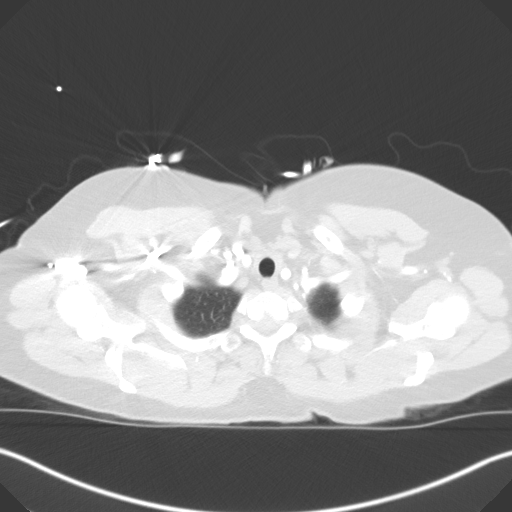

[Series 8: lung · axial · 0.73mm/px · z∈[+1170,+1210]mm · 2 of 145 slices shown]
[im 11/145  soft-tissue]
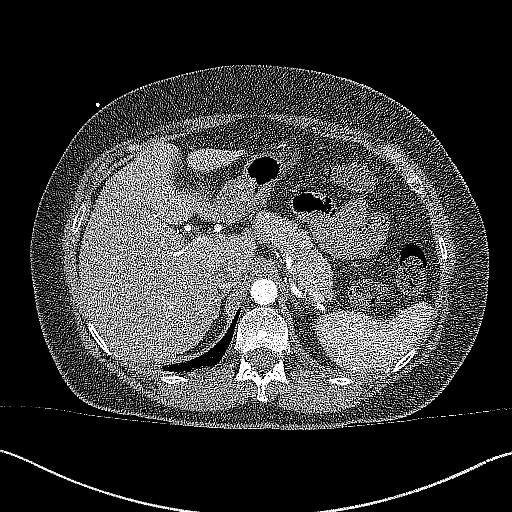
[im 31/145  soft-tissue]
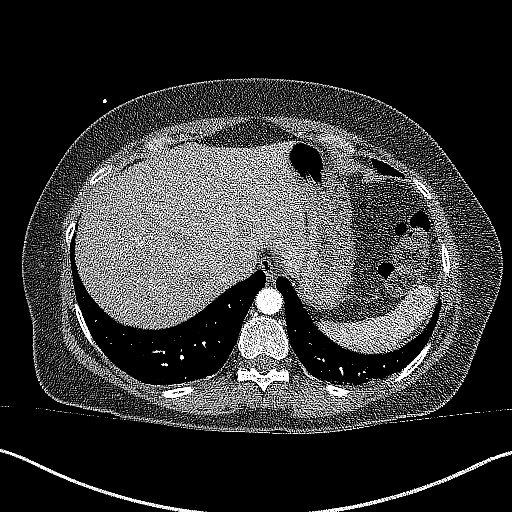

[Series 9: coronals · coronal · 0.63mm/px · 3 of 138 slices shown]
[im 35/138  soft-tissue]
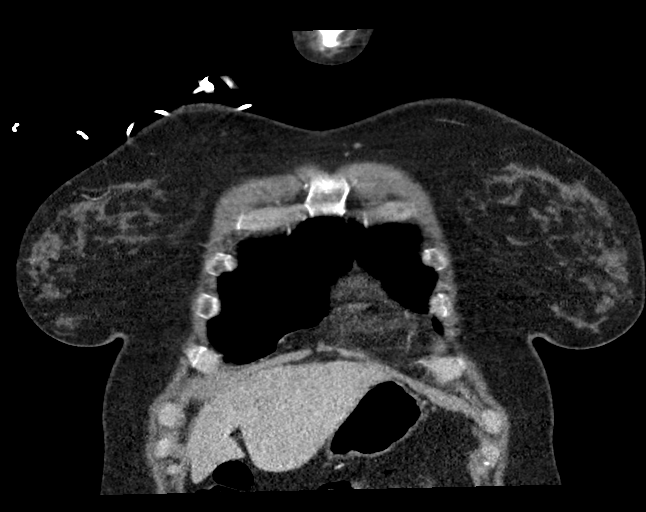
[im 69/138  soft-tissue]
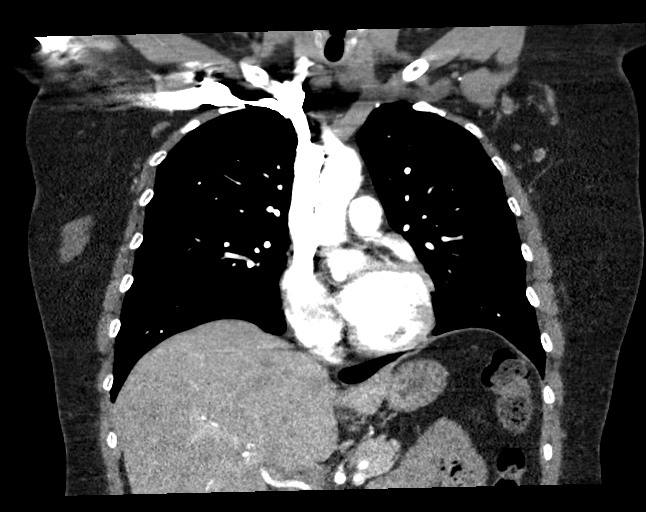
[im 103/138  soft-tissue]
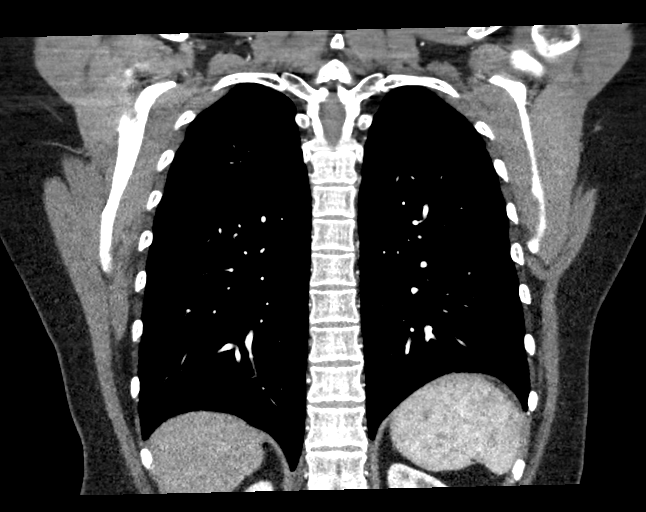

[Series 13: axial pre · axial · non-contrast · 0.71mm/px · z∈[+1198,+1378]mm · 4 of 60 slices shown]
[im 12/60  lung]
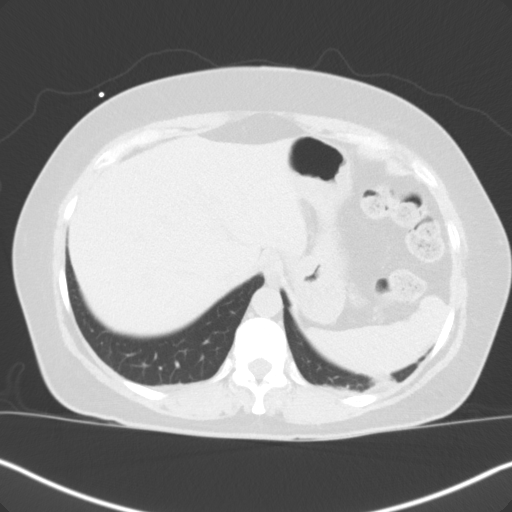
[im 24/60  lung]
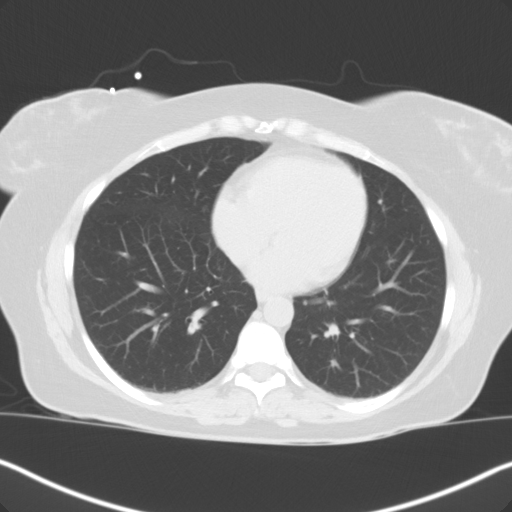
[im 36/60  lung]
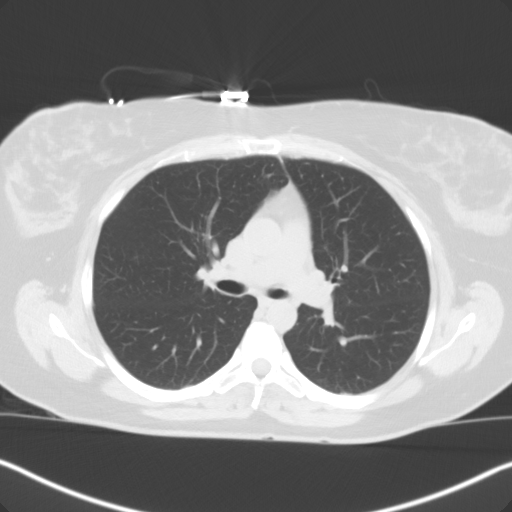
[im 48/60  lung]
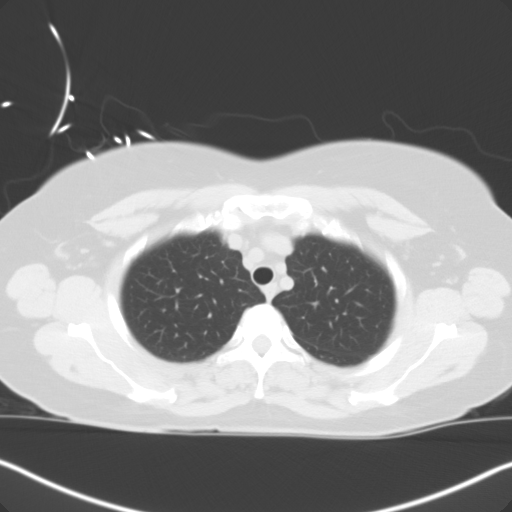

[18 of 46 positions shown; findings below may reference images not displayed]

FINDINGS: Cardiovascular: Thoracic aorta is normal in caliber. No dissection.
No atherosclerosis. Aortic arch branch vessels are widely patent.

Heart is normal in size and configuration. No pericardial effusion.
No coronary artery calcifications. Pulmonary arteries are normal in
caliber. No central pulmonary embolus.

Mediastinum/Nodes: No neck base, axillary, mediastinal or hilar
masses or enlarged lymph nodes. Normal trachea and esophagus.

Lungs/Pleura: Minimal lung base and dependent lower lobe
subsegmental atelectasis. Lungs are otherwise clear. No pleural
effusion or pneumothorax.

Upper Abdomen: Status post cholecystectomy.  Otherwise unremarkable.

Musculoskeletal: No chest wall abnormality. No acute or significant
osseous findings.

Review of the MIP images confirms the above findings.
IMPRESSION: 1. No aortic dissection, aneurysm or atherosclerosis. No central
pulmonary embolus.
2. Normal exam other than minor subsegmental atelectasis in the
lungs.

## 2019-12-10 NOTE — Progress Notes (Signed)
   Subjective:  Patient presents today status post Tailor's bunionectomy and tibial sesamoidectomy left. DOS: 10/12/2019. She reports numbness of the left great toe and pain to the lateral foot. She has not been using the compression anklet or the CAM boot. She denies modifying factors. Patient is here for further evaluation and treatment.   Past Medical History:  Diagnosis Date  . Anxiety   . Bipolar disease, chronic (HCC)   . Hypothyroidism   . Major depression   . Thyroid disease       Objective/Physical Exam Neurovascular status intact.  Skin incisions appear to be well coapted. No sign of infectious process noted. No dehiscence. No active bleeding noted. Moderate edema noted to the surgical extremity.  Radiographic Exam:  Orthopedic hardware and osteotomies sites appear to be stable with routine healing.  Assessment: 1. s/p Tailor's bunionectomy and tibial sesamoidectomy left. DOS: 10/12/2019   Plan of Care:  1. Patient was evaluated. X-Rays reviewed.  2. May resume full activity with no restrictions.  3. Recommended good shoe gear.  4. Prescription for Meloxicam provided to patient. 5. Return to clinic in 4 weeks for final follow up visit and X-Rays.   Husband does water meters for [employer]. His name is Minerva Areola.    Felecia Shelling, DPM [facility]  Dr. Felecia Shelling, DPM    [address]                                        [place], [place] [address]                Office [phone]  Fax [phone]

## 2020-01-03 ENCOUNTER — Encounter: Payer: Commercial Managed Care - PPO | Admitting: Podiatry

## 2020-01-24 ENCOUNTER — Encounter: Payer: Commercial Managed Care - PPO | Admitting: Podiatry

## 2020-02-12 ENCOUNTER — Ambulatory Visit: Payer: Commercial Managed Care - PPO | Admitting: Podiatry

## 2020-02-28 ENCOUNTER — Ambulatory Visit (INDEPENDENT_AMBULATORY_CARE_PROVIDER_SITE_OTHER): Payer: Commercial Managed Care - PPO | Admitting: Podiatry

## 2020-02-28 ENCOUNTER — Ambulatory Visit (INDEPENDENT_AMBULATORY_CARE_PROVIDER_SITE_OTHER): Payer: Commercial Managed Care - PPO

## 2020-02-28 ENCOUNTER — Other Ambulatory Visit: Payer: Self-pay

## 2020-02-28 DIAGNOSIS — L905 Scar conditions and fibrosis of skin: Secondary | ICD-10-CM | POA: Diagnosis not present

## 2020-02-28 DIAGNOSIS — M21622 Bunionette of left foot: Secondary | ICD-10-CM

## 2020-02-28 DIAGNOSIS — M7752 Other enthesopathy of left foot: Secondary | ICD-10-CM

## 2020-02-28 DIAGNOSIS — M2580 Other specified joint disorders, unspecified joint: Secondary | ICD-10-CM

## 2020-02-28 DIAGNOSIS — M21612 Bunion of left foot: Secondary | ICD-10-CM | POA: Diagnosis not present

## 2020-02-28 NOTE — Progress Notes (Signed)
Subjective:  Patient presents today status post Tailor's bunionectomy and tibial sesamoidectomy left. DOS: 10/12/2019.  She continues to experience pain and tenderness to the incision site of the medial aspect of the left great toe incision site.  She also has pain and tenderness associated to the pinky toe of the surgical foot as well.  She states it is hard to walk barefoot on the hardwood floors.  She states that the pain is 4/10.  She is also having some slight swelling to the foot as well.  She is here for further evaluation  Past Medical History:  Diagnosis Date  . Anxiety   . Bipolar disease, chronic (HCC)   . Hypothyroidism   . Major depression   . Thyroid disease       Objective: Physical Exam General: The patient is alert and oriented x3 in no acute distress.  Dermatology: Skin is cool, dry and supple bilateral lower extremities. Negative for open lesions or macerations.  Skin incisions are completely healed.  Vascular: Palpable pedal pulses bilaterally. No edema or erythema noted. Capillary refill within normal limits.  Neurological: Epicritic and protective threshold grossly intact bilaterally.   Musculoskeletal Exam: All pedal and ankle joints range of motion within normal limits bilateral. Muscle strength 5/5 in all groups bilateral.  There is some tenderness to palpation along the medial aspect of the first MTPJ of the left foot surgical site along the incision.  There is also pain with the direct palpation of the hypertrophic medial eminence of the first metatarsal head.  Patient does have moderate bunion deformity. There is also pain with palpation and range of motion to the fifth MTPJ.  The fifth MTPJ does have some limited range of motion as well with findings concerning for iatrogenic scar tissue adhesion   Radiographic Exam:  Orthopedic hardware and osteotomies sites appear to be stable with routine healing.  Increased intermetatarsal angle with increased hallux  abductus angle consistent with bunion deformity.  Absence of the tibial sesamoid noted.  The orthopedic screw to the fifth metatarsal of the surgical foot appears stable and intact.  Complete healing noted.  The capital fragment/fifth metatarsal head appears translocated in the more alleviated position compared to preoperative x-rays  Assessment: 1. s/p Tailor's bunionectomy and tibial sesamoidectomy left. DOS: 10/12/2019 2.  Hallux valgus deformity left foot with pain 3.  Fifth MTPJ capsulitis with adhesion and iatrogenic scar tissue formation   Plan of Care:  1. Patient was evaluated. X-Rays reviewed.  2.  Today we are going to order physical therapy for the patient to see if we can increase her range of motion and improve her gait as well as reduce pain in the surgical foot.  Prescription was provided for the patient to take to a local physical therapist.  The patient lives in [place], [place] and it would be best if she did not have to commute 2-3 times weekly into [facility] for physical therapy 3.  Return to clinic in 4 weeks  Husband does water meters for [employer]. His name is Minerva Areola.    Felecia Shelling, DPM [facility]  Dr. Felecia Shelling, DPM    [address]                                        [place], [place] [address]  Office [phone]  Fax [phone]

## 2020-04-03 ENCOUNTER — Ambulatory Visit: Payer: Commercial Managed Care - PPO | Admitting: Podiatry

## 2020-06-17 ENCOUNTER — Ambulatory Visit (INDEPENDENT_AMBULATORY_CARE_PROVIDER_SITE_OTHER): Payer: Commercial Managed Care - PPO

## 2020-06-17 ENCOUNTER — Other Ambulatory Visit: Payer: Self-pay

## 2020-06-17 ENCOUNTER — Encounter: Payer: Commercial Managed Care - PPO | Admitting: Podiatry

## 2020-06-17 DIAGNOSIS — M21622 Bunionette of left foot: Secondary | ICD-10-CM

## 2020-06-17 DIAGNOSIS — M2012 Hallux valgus (acquired), left foot: Secondary | ICD-10-CM

## 2020-06-23 NOTE — Progress Notes (Addendum)
   Subjective:  Patient presents today status post Tailor's bunionectomy and tibial sesamoidectomy left. DOS: 10/12/2019.  Patient states that since surgery she has noticed an increasing bunion deformity and pain to the medial eminence of the left foot.  Overall she does state that she is feeling better since prior to surgery.  She presents for further treatment evaluation  Past Medical History:  Diagnosis Date  . Anxiety   . Bipolar disease, chronic (HCC)   . Hypothyroidism   . Major depression   . Thyroid disease       Objective: Physical Exam General: The patient is alert and oriented x3 in no acute distress.  Dermatology: Skin is cool, dry and supple bilateral lower extremities. Negative for open lesions or macerations.  Skin incisions are completely healed.  Vascular: Palpable pedal pulses bilaterally. No edema or erythema noted. Capillary refill within normal limits.  Neurological: Epicritic and protective threshold grossly intact bilaterally.   Musculoskeletal Exam: All pedal and ankle joints range of motion within normal limits bilateral. Muscle strength 5/5 in all groups bilateral.  There is pain with the direct palpation of the hypertrophic medial eminence of the first metatarsal head.  Patient does have moderate bunion deformity. Negative for any pain on palpation or range of motion of the fifth MTPJ.  Fifth MTPJ appears completely resolved and healed.  No pain or tenderness to this area  Radiographic Exam:  Orthopedic hardware and osteotomies sites appear to be stable with routine healing.  Increased intermetatarsal angle with increased hallux abductus angle consistent with bunion deformity.  Absence of the tibial sesamoid noted.  The orthopedic screw to the fifth metatarsal of the surgical foot appears stable and intact.  Complete healing noted.  The capital fragment/fifth metatarsal head appears translocated in the more alleviated position compared to preoperative  x-rays  Assessment: 1. s/p Tailor's bunionectomy and tibial sesamoidectomy left. DOS: 10/12/2019 2.  Hallux valgus deformity left foot with pain  Plan of Care:  1. Patient was evaluated. X-Rays reviewed.  2.  Today we discussed that possible removal of the tibial sesmoid perhaps may have increased the bunion deformity to her left foot. Discussed conservative versus surgical management of the bunion deformity.  3.  Bunion pad was provided for the patient today to wear daily. 4.  Recommend that the patient follow-up as needed for surgical consultation.  The patient would like to go home and I do recommend that she goes home and discusses it with her husband. 5.  Return to clinic as needed   Husband does water meters for [employer]. His name is Minerva Areola.    Felecia Shelling, DPM [facility]  Dr. Felecia Shelling, DPM    [address]                                        [place], [place] [address]                Office [phone]  Fax [phone]

## 2020-06-25 NOTE — Addendum Note (Signed)
Addended by: Felecia Shelling on: 06/25/2020 01:04 PM   Modules accepted: Level of Service

## 2021-02-04 ENCOUNTER — Encounter (INDEPENDENT_AMBULATORY_CARE_PROVIDER_SITE_OTHER): Payer: Self-pay | Admitting: *Deleted

## 2021-04-08 ENCOUNTER — Other Ambulatory Visit (INDEPENDENT_AMBULATORY_CARE_PROVIDER_SITE_OTHER): Payer: Self-pay

## 2021-04-08 DIAGNOSIS — Z1211 Encounter for screening for malignant neoplasm of colon: Secondary | ICD-10-CM

## 2021-04-14 ENCOUNTER — Telehealth (INDEPENDENT_AMBULATORY_CARE_PROVIDER_SITE_OTHER): Payer: Self-pay

## 2021-04-14 ENCOUNTER — Encounter (INDEPENDENT_AMBULATORY_CARE_PROVIDER_SITE_OTHER): Payer: Self-pay

## 2021-04-14 MED ORDER — PEG 3350-KCL-NA BICARB-NACL 420 G PO SOLR: 4000.0000 mL | ORAL | 0 refills | Status: DC

## 2021-04-14 NOTE — Telephone Encounter (Signed)
LeighAnn Jesyca Weisenburger, CMA  

## 2021-04-14 NOTE — Telephone Encounter (Signed)
Referring MD/PCP: Arlyn Leak  Procedure: Tcs  Reason/Indication:  Screening  Has patient had this procedure before?  no  If so, when, by whom and where?    Is there a family history of colon cancer?  no  Who?  What age when diagnosed?    Is patient diabetic? If yes, Type 1 or Type 2   no      Does patient have prosthetic heart valve or mechanical valve?  no  Do you have a pacemaker/defibrillator?  no  Has patient ever had endocarditis/atrial fibrillation? no  Does patient use oxygen? no  Has patient had joint replacement within last 12 months?  no  Is patient constipated or do they take laxatives? yes  Does patient have a history of alcohol/drug use?  no  Have you had a stroke/heart attack last 6 mths? no  Do you take medicine for weight loss?  no  For female patients,: do you still have your menstrual cycle? no  Is patient on blood thinner such as Coumadin, Plavix and/or Aspirin? no  Medications: venlafaxine 75 mg tid, provers 2.5 mg daily, estrace 0.5 mg daily, Wellbutrin 300 mg daily, Synthroid 112 mcg daily, Clonidine 1 mg nightly, alprazolam 1 mg bid, vraylor 6 mg nightly, Seroquel 200 mg nightly  Allergies: codeine  Medication Adjustment per Dr Levon Hedger none  Procedure date & time: 05/14/21 12:30

## 2021-04-24 NOTE — Telephone Encounter (Signed)
Ok to schedule.  Thanks,  Kennadie Brenner Castaneda Mayorga, MD Gastroenterology and Hepatology [facility]  

## 2021-05-14 ENCOUNTER — Ambulatory Visit (HOSPITAL_COMMUNITY)
Admission: RE | Admit: 2021-05-14 | Discharge: 2021-05-14 | Disposition: A | Discharge status: Home / Self Care | Payer: Commercial Managed Care - PPO | Source: Ambulatory Visit | Attending: Gastroenterology | Admitting: Gastroenterology

## 2021-05-14 ENCOUNTER — Encounter (HOSPITAL_COMMUNITY): Payer: Self-pay | Admitting: Gastroenterology

## 2021-05-14 ENCOUNTER — Ambulatory Visit (HOSPITAL_COMMUNITY): Payer: Commercial Managed Care - PPO | Admitting: Anesthesiology

## 2021-05-14 ENCOUNTER — Other Ambulatory Visit: Payer: Self-pay

## 2021-05-14 ENCOUNTER — Encounter (HOSPITAL_COMMUNITY)
Admission: RE | Disposition: A | Discharge status: Home / Self Care | Payer: Self-pay | Source: Ambulatory Visit | Attending: Gastroenterology

## 2021-05-14 DIAGNOSIS — Z1211 Encounter for screening for malignant neoplasm of colon: Secondary | ICD-10-CM | POA: Diagnosis present

## 2021-05-14 DIAGNOSIS — Z888 Allergy status to other drugs, medicaments and biological substances status: Secondary | ICD-10-CM | POA: Diagnosis not present

## 2021-05-14 DIAGNOSIS — Z793 Long term (current) use of hormonal contraceptives: Secondary | ICD-10-CM | POA: Diagnosis not present

## 2021-05-14 DIAGNOSIS — E039 Hypothyroidism, unspecified: Secondary | ICD-10-CM | POA: Diagnosis not present

## 2021-05-14 DIAGNOSIS — Z9049 Acquired absence of other specified parts of digestive tract: Secondary | ICD-10-CM | POA: Diagnosis not present

## 2021-05-14 DIAGNOSIS — Z885 Allergy status to narcotic agent status: Secondary | ICD-10-CM | POA: Diagnosis not present

## 2021-05-14 DIAGNOSIS — Z79899 Other long term (current) drug therapy: Secondary | ICD-10-CM | POA: Insufficient documentation

## 2021-05-14 DIAGNOSIS — Z79890 Hormone replacement therapy (postmenopausal): Secondary | ICD-10-CM | POA: Diagnosis not present

## 2021-05-14 HISTORY — PX: COLONOSCOPY WITH PROPOFOL: SHX5780

## 2021-05-14 LAB — HM COLONOSCOPY

## 2021-05-14 SURGERY — COLONOSCOPY WITH PROPOFOL
Anesthesia: General

## 2021-05-14 MED ORDER — PROPOFOL 10 MG/ML IV BOLUS
INTRAVENOUS | Status: DC | PRN
  Administered 2021-05-14: 40 mg via INTRAVENOUS

## 2021-05-14 MED ORDER — PROPOFOL 500 MG/50ML IV EMUL
INTRAVENOUS | Status: DC | PRN
  Administered 2021-05-14: 150 ug/kg/min via INTRAVENOUS

## 2021-05-14 MED ORDER — LACTATED RINGERS IV SOLN: INTRAVENOUS | Status: DC

## 2021-05-14 NOTE — Transfer of Care (Signed)
Immediate Anesthesia Transfer of Care Note  Patient: Kaylee Boyer  Procedure(s) Performed: COLONOSCOPY WITH PROPOFOL  Patient Location: Endoscopy Unit  Anesthesia Type:General  Level of Consciousness: awake  Airway & Oxygen Therapy: Patient Spontanous Breathing  Post-op Assessment: Report given to RN  Post vital signs: Reviewed  Last Vitals:  Vitals Value Taken Time  BP    Temp    Pulse    Resp    SpO2      Last Pain:  Vitals:   05/14/21 1127  TempSrc: Oral  PainSc: 0-No pain      Patients Stated Pain Goal: 8 (05/14/21 1127)  Complications: No notable events documented.

## 2021-05-14 NOTE — Anesthesia Postprocedure Evaluation (Signed)
Anesthesia Post Note  Patient: Kaylee Boyer  Procedure(s) Performed: COLONOSCOPY WITH PROPOFOL  Patient location during evaluation: Endoscopy Anesthesia Type: General Level of consciousness: awake and alert Pain management: pain level controlled Vital Signs Assessment: post-procedure vital signs reviewed and stable Respiratory status: spontaneous breathing Cardiovascular status: blood pressure returned to baseline and stable Postop Assessment: no apparent nausea or vomiting Anesthetic complications: no   No notable events documented.   Last Vitals:  Vitals:   05/14/21 1127 05/14/21 1215  BP: 114/63   Pulse: 86   Resp: 16 16  Temp: 36.5 C 36.5 C  SpO2: 96% 99%    Last Pain:  Vitals:   05/14/21 1215  TempSrc: Oral  PainSc:                  Glynn Octave

## 2021-05-14 NOTE — Discharge Instructions (Signed)
You are being discharged to home.  Resume your previous diet.  Your physician has recommended a repeat colonoscopy in 10 years for screening purposes.  

## 2021-05-14 NOTE — Anesthesia Preprocedure Evaluation (Signed)
Anesthesia Evaluation  Patient identified by MRN, date of birth, ID band Patient awake    Reviewed: Allergy & Precautions, H&P , NPO status , Patient's Chart, lab work & pertinent test results, reviewed documented beta blocker date and time   Airway Mallampati: II  TM Distance: >3 FB Neck ROM: full    Dental no notable dental hx.    Pulmonary neg pulmonary ROS,    Pulmonary exam normal breath sounds clear to auscultation       Cardiovascular Exercise Tolerance: Good negative cardio ROS   Rhythm:regular Rate:Normal     Neuro/Psych PSYCHIATRIC DISORDERS Anxiety Depression Bipolar Disorder negative neurological ROS     GI/Hepatic Neg liver ROS, GERD  Medicated,  Endo/Other  Hypothyroidism   Renal/GU negative Renal ROS  negative genitourinary   Musculoskeletal   Abdominal   Peds  Hematology negative hematology ROS (+)   Anesthesia Other Findings   Reproductive/Obstetrics negative OB ROS                             Anesthesia Physical Anesthesia Plan  ASA: 2  Anesthesia Plan: General   Post-op Pain Management:    Induction:   PONV Risk Score and Plan: Propofol infusion  Airway Management Planned:   Additional Equipment:   Intra-op Plan:   Post-operative Plan:   Informed Consent: I have reviewed the patients History and Physical, chart, labs and discussed the procedure including the risks, benefits and alternatives for the proposed anesthesia with the patient or authorized representative who has indicated his/her understanding and acceptance.     Dental Advisory Given  Plan Discussed with: CRNA  Anesthesia Plan Comments:         Anesthesia Quick Evaluation

## 2021-05-14 NOTE — Op Note (Signed)
[facility] Patient Name: Kaylee Boyer Procedure Date: 05/14/2021 11:27 AM MRN: [id number] Date of Birth: [birth year] Attending MD: Katrinka Blazing ,  CSN: [id number] Age: 50 Admit Type: Outpatient Procedure:                Colonoscopy Indications:              Screening for colorectal malignant neoplasm Providers:                Katrinka Blazing, Crystal Page, Kristine L. Jessee Avers, Technician Referring MD:              Medicines:                Monitored Anesthesia Care Complications:            No immediate complications. Estimated Blood Loss:     Estimated blood loss: none. Procedure:                Pre-Anesthesia Assessment:                           - Prior to the procedure, a History and Physical                            was performed, and patient medications, allergies                            and sensitivities were reviewed. The patient's                            tolerance of previous anesthesia was reviewed.                           - The risks and benefits of the procedure and the                            sedation options and risks were discussed with the                            patient. All questions were answered and informed                            consent was obtained.                           - ASA Grade Assessment: II - A patient with mild                            systemic disease.                           After obtaining informed consent, the colonoscope                            was passed under direct vision. Throughout the  procedure, the patient's blood pressure, pulse, and                            oxygen saturations were monitored continuously. The                            [id number]-HQ190L ([id number]) scope was introduced through                            the anus and advanced to the the cecum, identified                            by appendiceal orifice and ileocecal valve. The                             colonoscopy was performed without difficulty. The                            patient tolerated the procedure well. The quality                            of the bowel preparation was excellent. Scope In: 11:48:29 AM Scope Out: 12:09:26 PM Scope Withdrawal Time: 0 hours 12 minutes 8 seconds  Total Procedure Duration: 0 hours 20 minutes 57 seconds  Findings:      The perianal and digital rectal examinations were normal.      The entire examined colon appeared normal.      The retroflexed view of the distal rectum and anal verge was normal and       showed no anal or rectal abnormalities. Impression:               - The entire examined colon is normal.                           - The distal rectum and anal verge are normal on                            retroflexion view.                           - No specimens collected. Moderate Sedation:      Per Anesthesia Care Recommendation:           - Discharge patient to home (ambulatory).                           - Resume previous diet.                           - Repeat colonoscopy in 10 years for screening                            purposes. Procedure Code(s):        --- Professional ---  Y6503, Colorectal cancer screening; colonoscopy on                            individual not meeting criteria for high risk Diagnosis Code(s):        --- Professional ---                           Z12.11, Encounter for screening for malignant                            neoplasm of colon CPT copyright 2019 [employer]. All rights reserved. The codes documented in this report are preliminary and upon coder review may  be revised to meet current compliance requirements. Katrinka Blazing, MD Katrinka Blazing,  05/14/2021 12:13:06 PM This report has been signed electronically. Number of Addenda: 0

## 2021-05-14 NOTE — H&P (Signed)
Kaylee Boyer is an 50 y.o. female.   Chief Complaint: screening colonoscopy HPI: 50 year old female with past medical history of anxiety, bipolar disease, hypothyroidism, depression, coming for screening colonoscopy. The patient has never had a colonoscopy in the past.  The patient denies having any complaints such as melena, hematochezia, abdominal pain or distention, change in her bowel movement consistency or frequency, no changes in her weight recently.  No family history of colorectal cancer.   Past Medical History:  Diagnosis Date   Anxiety    Bipolar disease, chronic (HCC)    Hypothyroidism    Major depression    Thyroid disease     Past Surgical History:  Procedure Laterality Date   CHOLECYSTECTOMY      6 yrs ago   ESOPHAGOGASTRODUODENOSCOPY N/A 01/16/2016   Procedure: ESOPHAGOGASTRODUODENOSCOPY (EGD);  Surgeon: Malissa Hippo, MD;  Location: [facility];  Service: Endoscopy;  Laterality: N/A;  10:30    Family History  Problem Relation Age of Onset   Atrial fibrillation Mother    Myelodysplastic syndrome Father    Social History:  reports that she has never smoked. She has never used smokeless tobacco. She reports current alcohol use. She reports that she does not use drugs.  Allergies:  Allergies  Allergen Reactions   Other Other (See Comments)    Band aids - Blisters   Codeine Rash    Medications Prior to Admission  Medication Sig Dispense Refill   ALPRAZolam (XANAX) 1 MG tablet Take 2 mg by mouth at bedtime.     buPROPion (WELLBUTRIN XL) 300 MG 24 hr tablet Take 300 mg by mouth daily.     Cariprazine HCl (VRAYLAR) 6 MG CAPS Take 6 mg by mouth at bedtime.     cloNIDine (CATAPRES) 0.1 MG tablet Take 0.1 mg by mouth at bedtime.     estradiol (ESTRACE) 0.1 MG/GM vaginal cream Place 1 Applicatorful vaginally daily as needed (irritation).     estradiol (ESTRACE) 0.5 MG tablet Take 0.5 mg by mouth daily.     lamoTRIgine (LAMICTAL) 25 MG tablet Take 100 mg by  mouth at bedtime.     levothyroxine (SYNTHROID) 112 MCG tablet Take 112 mcg by mouth daily before breakfast.     Magnesium 500 MG TABS Take 1,000 mg by mouth daily.     medroxyPROGESTERone (PROVERA) 2.5 MG tablet Take 2.5 mg by mouth daily.     polyethylene glycol-electrolytes (TRILYTE) 420 g solution Take 4,000 mLs by mouth as directed. 4000 mL 0   QUEtiapine (SEROQUEL) 200 MG tablet Take 200 mg by mouth at bedtime. 8 pm at night.     venlafaxine XR (EFFEXOR-XR) 75 MG 24 hr capsule Take 225 mg by mouth daily.      No results found for this or any previous visit (from the past 48 hour(s)). No results found.  Review of Systems  Constitutional: Negative.   HENT: Negative.    Eyes: Negative.   Respiratory: Negative.    Cardiovascular: Negative.   Endocrine: Negative.   Genitourinary: Negative.   Musculoskeletal: Negative.   Skin: Negative.   Allergic/Immunologic: Negative.   Neurological: Negative.   Hematological: Negative.   Psychiatric/Behavioral: Negative.     Blood pressure 114/63, pulse 86, temperature 97.7 F (36.5 C), temperature source Oral, resp. rate 16, height 5\' 4"  (1.626 m), weight 85.3 kg, last menstrual period 12/18/2015, SpO2 96 %. Physical Exam  GENERAL: The patient is AO x3, in no acute distress. HEENT: Head is normocephalic and atraumatic. EOMI  are intact. Mouth is well hydrated and without lesions. NECK: Supple. No masses LUNGS: Clear to auscultation. No presence of rhonchi/wheezing/rales. Adequate chest expansion HEART: RRR, normal s1 and s2. ABDOMEN: Soft, nontender, no guarding, no peritoneal signs, and nondistended. BS +. No masses. EXTREMITIES: Without any cyanosis, clubbing, rash, lesions or edema. NEUROLOGIC: AOx3, no focal motor deficit. SKIN: no jaundice, no rashes  Assessment/Plan 50 year old female with past medical history of anxiety, bipolar disease, hypothyroidism, depression, coming for screening colonoscopy. The patient is at average risk for  colorectal cancer.  We will proceed with colonoscopy today.   Dolores Frame, MD 05/14/2021, 11:42 AM

## 2021-05-15 ENCOUNTER — Encounter (INDEPENDENT_AMBULATORY_CARE_PROVIDER_SITE_OTHER): Payer: Self-pay | Admitting: *Deleted

## 2021-05-16 ENCOUNTER — Encounter (HOSPITAL_COMMUNITY): Payer: Self-pay | Admitting: Gastroenterology

## 2022-08-14 NOTE — Progress Notes (Signed)
CARDIOLOGY CONSULT NOTE       Patient ID: Kaylee Boyer MRN: [id number] DOB/AGE: 03/21/1971 52 y.o.  Admit date: (Not on file) Referring Physician: Sasser Primary Physician: Manon Hilding, MD Primary Cardiologist: New Reason for Consultation: Syncope  Active Problems:   * No active hospital problems. *   HPI:  52 y.o. referred by Dr Quintin Alto for syncope History of bipolar, anxiety Hypothyroidism, depression. No cardiac history Seen by primary 08/13/22 complaining of passing out ECG done in office NSR normal rate 80 normal BP Labs including TSH normal except for Cr 1.32 with normal BUN 9 Hct 41.2 Unemployed ? Disabled Occasional ETOH  non smoker She sees psych and is on xanax, wellbutrin, lamictal, seroquel and effexor   She indicates passing out and fell Neighbor witness no seizure activity Post fall with right knee pain   She has not had recurrence No antecedent symptoms Her psychiatrist has prescribed all of these medications  Married One son in [place] in town She is not working currently   ROS All other systems reviewed and negative except as noted above  Past Medical History:  Diagnosis Date   Anxiety    Bipolar disease, chronic ([place])    Hypothyroidism    Major depression    Thyroid disease     Family History  Problem Relation Age of Onset   Atrial fibrillation Mother    Myelodysplastic syndrome Father     Social History   Socioeconomic History   Marital status: Married    Spouse name: Not on file   Number of children: Not on file   Years of education: Not on file   Highest education level: Not on file  Occupational History   Not on file  Tobacco Use   Smoking status: Never   Smokeless tobacco: Never  Vaping Use   Vaping Use: Never used  Substance and Sexual Activity   Alcohol use: Yes    Alcohol/week: 0.0 standard drinks of alcohol    Comment: Occasional ~ 2x/month   Drug use: No   Sexual activity: Not on file  Other Topics Concern   Not  on file  Social History Narrative   Not on file   Social Determinants of Health   Financial Resource Strain: Not on file  Food Insecurity: Not on file  Transportation Needs: Not on file  Physical Activity: Not on file  Stress: Not on file  Social Connections: Not on file  Intimate Partner Violence: Not on file    Past Surgical History:  Procedure Laterality Date   CHOLECYSTECTOMY      6 yrs ago   COLONOSCOPY WITH PROPOFOL N/A 05/14/2021   Procedure: COLONOSCOPY WITH PROPOFOL;  Surgeon: Harvel Quale, MD;  Location: [facility];  Service: Gastroenterology;  Laterality: N/A;  12:30   ESOPHAGOGASTRODUODENOSCOPY N/A 01/16/2016   Procedure: ESOPHAGOGASTRODUODENOSCOPY (EGD);  Surgeon: Rogene Houston, MD;  Location: [facility];  Service: Endoscopy;  Laterality: N/A;  10:30      Current Outpatient Medications:    ALPRAZolam (XANAX) 1 MG tablet, Take 2 mg by mouth at bedtime., Disp: , Rfl:    Cariprazine HCl (VRAYLAR) 6 MG CAPS, Take 6 mg by mouth at bedtime., Disp: , Rfl:    Dextromethorphan-buPROPion ER (AUVELITY) 45-105 MG TBCR, Take by mouth in the morning and at bedtime., Disp: , Rfl:    estradiol (ESTRACE) 0.1 MG/GM vaginal cream, Place 1 Applicatorful vaginally daily as needed (irritation)., Disp: , Rfl:    estradiol (  ESTRACE) 0.5 MG tablet, Take 0.5 mg by mouth daily., Disp: , Rfl:    lamoTRIgine (LAMICTAL) 25 MG tablet, Take 100 mg by mouth at bedtime., Disp: , Rfl:    levothyroxine (SYNTHROID) 112 MCG tablet, Take 112 mcg by mouth daily before breakfast., Disp: , Rfl:    medroxyPROGESTERone (PROVERA) 2.5 MG tablet, Take 2.5 mg by mouth daily., Disp: , Rfl:    QUEtiapine (SEROQUEL) 200 MG tablet, Take 200 mg by mouth at bedtime. 8 pm at night., Disp: , Rfl:    venlafaxine XR (EFFEXOR-XR) 75 MG 24 hr capsule, Take 225 mg by mouth daily., Disp: , Rfl:     Physical Exam: Blood pressure 100/72, pulse 75, height 5\' 4"  (1.626 m), weight 203 lb (92.1 kg), last  menstrual period 12/18/2015, SpO2 99 %.    Affect appropriate Healthy:  appears stated age 52: normal Neck supple with no adenopathy JVP normal no bruits no thyromegaly Lungs clear with no wheezing and good diaphragmatic motion Heart:  S1/S2 no murmur, no rub, gallop or click PMI normal Abdomen: benighn, BS positve, no tenderness, no AAA no bruit.  No HSM or HJR Distal pulses intact with no bruits No edema Neuro non-focal Skin warm and dry No muscular weakness   Labs:   Lab Results  Component Value Date   WBC 6.5 09/30/2018   HGB 11.4 (L) 09/30/2018   HCT 36.4 09/30/2018   MCV 102.0 (H) 09/30/2018   PLT 297 09/30/2018   No results for input(s): "NA", "K", "CL", "CO2", "BUN", "CREATININE", "CALCIUM", "PROT", "BILITOT", "ALKPHOS", "ALT", "AST", "GLUCOSE" in the last 168 hours.  Invalid input(s): "LABALBU" Lab Results  Component Value Date   TROPONINI <0.03 09/29/2018    Lab Results  Component Value Date   CHOL 194 09/30/2018   Lab Results  Component Value Date   HDL 56 09/30/2018   Lab Results  Component Value Date   LDLCALC 107 (H) 09/30/2018   Lab Results  Component Value Date   TRIG 157 (H) 09/30/2018   Lab Results  Component Value Date   CHOLHDL 3.5 09/30/2018   No results found for: "LDLDIRECT"    Radiology: No results found.  EKG: See HPI      ASSESSMENT AND PLAN:   No obvious cardiac risk factors ECG normal will order TTE to r/o structural heart dx.   Bipolar:  with anxiety / and depression Suspect polypharmacy related to syncope f/u psych to simplify regimen Med list includes atypical antipsychotic Vraylar as well as xanax, wellbutrin, seroquel, effexor, lamictal and clonidine  Thyroid:  on synthroid replacement TSH normal  CRF:  Cr 1.3 not azotemic on labs f/u primary   TTE  F/U cardiology PRN   Signed: Jenkins Rouge 08/17/2022, 2:56 PM

## 2022-08-17 ENCOUNTER — Encounter: Payer: Self-pay | Admitting: Cardiovascular Disease

## 2022-08-17 ENCOUNTER — Ambulatory Visit: Payer: Commercial Managed Care - PPO | Attending: Cardiovascular Disease | Admitting: Cardiovascular Disease

## 2022-08-17 VITALS — BP 100/72 | HR 75 | Ht 64.0 in | Wt 203.0 lb

## 2022-08-17 DIAGNOSIS — F3160 Bipolar disorder, current episode mixed, unspecified: Secondary | ICD-10-CM

## 2022-08-17 DIAGNOSIS — F319 Bipolar disorder, unspecified: Secondary | ICD-10-CM | POA: Diagnosis not present

## 2022-08-17 DIAGNOSIS — R55 Syncope and collapse: Secondary | ICD-10-CM

## 2022-08-17 NOTE — Patient Instructions (Signed)
Medication Instructions:  Your physician recommends that you continue on your current medications as directed. Please refer to the Current Medication list given to you today.  *If you need a refill on your cardiac medications before your next appointment, please call your pharmacy*  Lab Work: If you have labs (blood work) drawn today and your tests are completely normal, you will receive your results only by: MyChart Message (if you have MyChart) OR A paper copy in the mail If you have any lab test that is abnormal or we need to change your treatment, we will call you to review the results.  Testing/Procedures: Your physician has requested that you have an echocardiogram. Echocardiography is a painless test that uses sound waves to create images of your heart. It provides your doctor with information about the size and shape of your heart and how well your heart's chambers and valves are working. This procedure takes approximately one hour. There are no restrictions for this procedure. Please do NOT wear cologne, perfume, aftershave, or lotions (deodorant is allowed). Please arrive 15 minutes prior to your appointment time.  Follow-Up: At [facility], you and your health needs are our priority.  As part of our continuing mission to provide you with exceptional heart care, we have created designated Provider Care Teams.  These Care Teams include your primary Cardiologist (physician) and Advanced Practice Providers (APPs -  Physician Assistants and Nurse Practitioners) who all work together to provide you with the care you need, when you need it.  We recommend signing up for the patient portal called "MyChart".  Sign up information is provided on this After Visit Summary.  MyChart is used to connect with patients for Virtual Visits (Telemedicine).  Patients are able to view lab/test results, encounter notes, upcoming appointments, etc.  Non-urgent messages can be sent to your provider as  well.   To learn more about what you can do with MyChart, go to [email or web address].    Your next appointment:   As needed  Provider:   Peter Nishan, MD     

## 2022-08-31 ENCOUNTER — Telehealth (HOSPITAL_COMMUNITY): Payer: Self-pay | Admitting: Cardiovascular Disease

## 2022-08-31 NOTE — Telephone Encounter (Signed)
Patient called and cancelled echocardiogram and does not wish to reschedule. Order will be removed from the Active echo WQ. Thank you

## 2022-09-09 ENCOUNTER — Other Ambulatory Visit (HOSPITAL_COMMUNITY): Payer: Commercial Managed Care - PPO

## 2023-12-30 ENCOUNTER — Ambulatory Visit (HOSPITAL_COMMUNITY): Payer: Self-pay | Admitting: Physician Assistant

## 2024-01-04 ENCOUNTER — Encounter (HOSPITAL_COMMUNITY): Payer: Self-pay

## 2024-01-04 NOTE — Progress Notes (Signed)
 Surgical Instructions   Your procedure is scheduled on Monday, June 16th, 2025. Report to [facility] Main Entrance "A" at 5:30 A.M., then check in with the Admitting office. Any questions or running late day of surgery: call [phone]  Questions prior to your surgery date: call [phone], Monday-Friday, 8am-4pm. If you experience any cold or flu symptoms such as cough, fever, chills, shortness of breath, etc. between now and your scheduled surgery, please notify us  at the above number.     Remember:  Do not eat after midnight the night before your surgery  You may drink clear liquids until 4:30 the morning of your surgery.   Clear liquids allowed are: Water, Non-Citrus Juices (without pulp), Carbonated Beverages, Clear Tea (no milk, honey, etc.), Black Coffee Only (NO MILK, CREAM OR POWDERED CREAMER of any kind), and Gatorade.    Take these medicines the morning of surgery with A SIP OF WATER: Alprazolam  (Xanax ) Dextromethorphan-bupropion ER (Auvelity) Levothyroxine  (Synthroid ) Venlafaxine XR (Effexor)   May take these medicines IF NEEDED: None.     One week prior to surgery, STOP taking any Aspirin  (unless otherwise instructed by your surgeon) Aleve, Naproxen, Ibuprofen, Motrin, Advil, Goody's, BC's, all herbal medications, fish oil, and non-prescription vitamins.                     Do NOT Smoke (Tobacco/Vaping) for 24 hours prior to your procedure.  If you use a CPAP at night, you may bring your mask/headgear for your overnight stay.   You will be asked to remove any contacts, glasses, piercing's, hearing aid's, dentures/partials prior to surgery. Please bring cases for these items if needed.    Patients discharged the day of surgery will not be allowed to drive home, and someone needs to stay with them for 24 hours.  SURGICAL WAITING ROOM VISITATION Patients may have no more than 2 support people in the waiting area - these visitors may rotate.   Pre-op nurse will  coordinate an appropriate time for 1 ADULT support person, who may not rotate, to accompany patient in pre-op.  Children under the age of 9 must have an adult with them who is not the patient and must remain in the main waiting area with an adult.  If the patient needs to stay at the hospital during part of their recovery, the visitor guidelines for inpatient rooms apply.  Please refer to the [facility] website for the visitor guidelines for any additional information.   If you received a COVID test during your pre-op visit  it is requested that you wear a mask when out in public, stay away from anyone that may not be feeling well and notify your surgeon if you develop symptoms. If you have been in contact with anyone that has tested positive in the last 10 days please notify you surgeon.      Pre-operative 5 CHG Bathing Instructions   You can play a key role in reducing the risk of infection after surgery. Your skin needs to be as free of germs as possible. You can reduce the number of germs on your skin by washing with CHG (chlorhexidine gluconate) soap before surgery. CHG is an antiseptic soap that kills germs and continues to kill germs even after washing.   DO NOT use if you have an allergy to chlorhexidine/CHG or antibacterial soaps. If your skin becomes reddened or irritated, stop using the CHG and notify one of our RNs at [phone].   Please shower with  the CHG soap starting 4 days before surgery using the following schedule:     Please keep in mind the following:  DO NOT shave, including legs and underarms, starting the day of your first shower.   You may shave your face at any point before/day of surgery.  Place clean sheets on your bed the day you start using CHG soap. Use a clean washcloth (not used since being washed) for each shower. DO NOT sleep with pets once you start using the CHG.   CHG Shower Instructions:  Wash your face and private area with normal soap. If  you choose to wash your hair, wash first with your normal shampoo.  After you use shampoo/soap, rinse your hair and body thoroughly to remove shampoo/soap residue.  Turn the water OFF and apply about 3 tablespoons (45 ml) of CHG soap to a CLEAN washcloth.  Apply CHG soap ONLY FROM YOUR NECK DOWN TO YOUR TOES (washing for 3-5 minutes)  DO NOT use CHG soap on face, private areas, open wounds, or sores.  Pay special attention to the area where your surgery is being performed.  If you are having back surgery, having someone wash your back for you may be helpful. Wait 2 minutes after CHG soap is applied, then you may rinse off the CHG soap.  Pat dry with a clean towel  Put on clean clothes/pajamas   If you choose to wear lotion, please use ONLY the CHG-compatible lotions that are listed below.  Additional instructions for the day of surgery: DO NOT APPLY any lotions, deodorants, cologne, or perfumes.   Do not bring valuables to the hospital. [facility] is not responsible for any belongings/valuables. Do not wear nail polish, gel polish, artificial nails, or any other type of covering on natural nails (fingers and toes) Do not wear jewelry or makeup Put on clean/comfortable clothes.  Please brush your teeth.  Ask your nurse before applying any prescription medications to the skin.     CHG Compatible Lotions   Aveeno Moisturizing lotion  Cetaphil Moisturizing Cream  Cetaphil Moisturizing Lotion  Clairol Herbal Essence Moisturizing Lotion, Dry Skin  Clairol Herbal Essence Moisturizing Lotion, Extra Dry Skin  Clairol Herbal Essence Moisturizing Lotion, Normal Skin  Curel Age Defying Therapeutic Moisturizing Lotion with Alpha Hydroxy  Curel Extreme Care Body Lotion  Curel Soothing Hands Moisturizing Hand Lotion  Curel Therapeutic Moisturizing Cream, Fragrance-Free  Curel Therapeutic Moisturizing Lotion, Fragrance-Free  Curel Therapeutic Moisturizing Lotion, Original Formula  Eucerin  Daily Replenishing Lotion  Eucerin Dry Skin Therapy Plus Alpha Hydroxy Crme  Eucerin Dry Skin Therapy Plus Alpha Hydroxy Lotion  Eucerin Original Crme  Eucerin Original Lotion  Eucerin Plus Crme Eucerin Plus Lotion  Eucerin TriLipid Replenishing Lotion  Keri Anti-Bacterial Hand Lotion  Keri Deep Conditioning Original Lotion Dry Skin Formula Softly Scented  Keri Deep Conditioning Original Lotion, Fragrance Free Sensitive Skin Formula  Keri Lotion Fast Absorbing Fragrance Free Sensitive Skin Formula  Keri Lotion Fast Absorbing Softly Scented Dry Skin Formula  Keri Original Lotion  Keri Skin Renewal Lotion Keri Silky Smooth Lotion  Keri Silky Smooth Sensitive Skin Lotion  Nivea Body Creamy Conditioning Oil  Nivea Body Extra Enriched Teacher, adult education Moisturizing Lotion Nivea Crme  Nivea Skin Firming Lotion  NutraDerm 30 Skin Lotion  NutraDerm Skin Lotion  NutraDerm Therapeutic Skin Cream  NutraDerm Therapeutic Skin Lotion  ProShield Protective Hand Cream  Provon moisturizing lotion  Please read over the following  fact sheets that you were given.

## 2024-01-04 NOTE — Progress Notes (Signed)
 PCP - Dr Alanna Hu Cardiologist - Dr Janelle Mediate Endocrinology - Donis Furnish, FNP  Chest x-ray - n/a EKG - n/a Stress Test - 09/30/18 ECHO - 09/30/18 Cardiac Cath - n/a  ICD Pacemaker/Loop - n/a  Sleep Study -  n/a CPAP - none  Diabetes - n/a  ASA & Blood Thinner Instructions:  n/a  ERAS - clear liquids til 0430 DOS.  Anesthesia review: no  STOP now taking any Aspirin  (unless otherwise instructed by your surgeon), Aleve, Naproxen, Ibuprofen, Motrin, Advil, Goody's, BC's, all herbal medications, fish oil, and all vitamins.   Coronavirus Screening Do you have any of the following symptoms:  Cough yes/no: No Fever (>100.29F)  yes/no: No Runny nose yes/no: No Sore throat yes/no: No Difficulty breathing/shortness of breath  yes/no: No  Have you traveled in the last 14 days and where? yes/no: No  Patient verbalized understanding of instructions that were given to them at the PAT appointment. Patient was also instructed that they will need to review over the PAT instructions again at home before surgery.

## 2024-01-05 ENCOUNTER — Other Ambulatory Visit: Payer: Self-pay

## 2024-01-05 ENCOUNTER — Encounter (HOSPITAL_COMMUNITY): Payer: Self-pay

## 2024-01-05 ENCOUNTER — Encounter (HOSPITAL_COMMUNITY)
Admission: RE | Admit: 2024-01-05 | Discharge: 2024-01-05 | Disposition: A | Discharge status: Home / Self Care | Source: Ambulatory Visit | Attending: Orthopedic Surgery | Admitting: Orthopedic Surgery

## 2024-01-05 VITALS — BP 120/82 | HR 95 | Temp 98.5°F | Resp 17 | Ht 64.0 in | Wt 198.0 lb

## 2024-01-05 DIAGNOSIS — Z01818 Encounter for other preprocedural examination: Secondary | ICD-10-CM

## 2024-01-05 DIAGNOSIS — Z01812 Encounter for preprocedural laboratory examination: Secondary | ICD-10-CM | POA: Diagnosis present

## 2024-01-05 HISTORY — DX: Unspecified dementia, unspecified severity, without behavioral disturbance, psychotic disturbance, mood disturbance, and anxiety: F03.90

## 2024-01-05 LAB — CBC
HCT: 42.3 % (ref 36.0–46.0)
Hemoglobin: 13.6 g/dL (ref 12.0–15.0)
MCH: 30.5 pg (ref 26.0–34.0)
MCHC: 32.2 g/dL (ref 30.0–36.0)
MCV: 94.8 fL (ref 80.0–100.0)
Platelets: 376 10*3/uL (ref 150–400)
RBC: 4.46 MIL/uL (ref 3.87–5.11)
RDW: 12.9 % (ref 11.5–15.5)
WBC: 7.7 10*3/uL (ref 4.0–10.5)
nRBC: 0 % (ref 0.0–0.2)

## 2024-01-05 LAB — SURGICAL PCR SCREEN
MRSA, PCR: NEGATIVE
Staphylococcus aureus: POSITIVE — AB

## 2024-01-08 ENCOUNTER — Ambulatory Visit (HOSPITAL_COMMUNITY): Payer: Self-pay | Admitting: Physician Assistant

## 2024-01-08 NOTE — H&P (Signed)
 HPI: The patient is a  year old  who presents for pre-operative visit in preparation for their , which is scheduled on  with Dr. Mort Ards, MD at . The patient has had symptoms in the  including  which has impacted their quality of life and ability to do activities of daily living. The patient currently has a diagnosis of   and has failed conservative treatments including . The patient has had  . The patient denies an active infection.  ROS: Constitutional Constitutional: no fever, no chills, no night sweats, no significant weight loss Cardiovascular Cardiovascular: no chest pain, no palpitations Respiratory Respiratory: no cough, no shortness of breath, No COPD Gastrointestinal Gastrointestinal: no vomiting, no nausea Musculoskeletal Musculoskeletal: no swelling in Joints, Joint Pain, back pain Neurologic Neurologic: no numbness, no tingling, no difficulty with balance  Physical Exam: Clinical exam: Kaylee Boyer is a pleasant individual, who appears younger than their stated age.   She is alert and orientated 3.   No shortness of breath, chest pain.   Abdomen is soft and non-tender, negative loss of bowel and bladder control, no rebound tenderness.   Negative: skin lesions abrasions contusions  Peripheral pulses: 2+ peripheral pulses bilaterally upper extremity..  LE compartments are: Soft and nontender.  Gait pattern: Normal  Assistive devices: None  Neuro: 5/5 motor strength in the lower extremity bilaterally. Negative Babinski test, no clonus, negative straight leg raise test. Symmetrical 2+ deep tendon reflexes. Normal sensation light touch throughout all lower extremity.  Musculoskeletal: Significant mid lumbar pain with palpation. No ecchymosis or bruising. Pain is localized to this area radiates into the paraspinal region. No SI joint pain.  Imaging: X-rays of the lumbar spine completed in March 2025 and repeated today demonstrate the L1 compression fracture. No  significant change is noted. No new fracture is seen on the x-rays today.  Lumbar MRI: completed on 10/30/23 was reviewed with the patient. It was completed at [facility]; I have independently reviewed the images as well as the radiology report. Acute L1 compression fracture with approximately 45% anterior loss of height no retropulsion. Minimal disc herniation without neural compression L3-4 L4-5. No significant stenosis. No ligamentous injury. No abnormal marrow signal changes other than the edema at L1 indicating acuity of the fracture.  Assessment and Plan:  patient has failed conservative management, therefore we have elected to proceed with the following surgical procedure  on  at  by Dr. Vaughn Georges.  The patient has not had any improvement of their symptoms with conservative treatment measures. Kaylee Boyer is a very pleasant 53 year old woman has had progressive debilitating pain since about February 2025. The patient was diagnosed with pneumonia and was coughing and felt significant upper lumbar pain. She ultimately presented and had an x-ray which demonstrated an L1 compression fracture. Patient subsequently had an MRI which confirmed the fracture. Repeat imaging today demonstrates the fracture is not progressed.    Patient has a pathological fracture consistent with osteopenia/osteoporosis.  On exam she has no neurological deficits and she is not myelopathic.  She is having tenderness to palpation over the fracture site  Imaging: X-rays of the lumbar spine demonstrate the L1 compression fracture. No significant change is noted. No new fracture is seen on the x-rays today.  Lumbar MRI: completed on 10/30/23 was completed at [facility];  Acute L1 compression fracture with approximately 45% anterior loss of height no retropulsion. Minimal disc herniation without neural compression L3-4 L4-5. No significant stenosis. No ligamentous injury. No abnormal marrow signal changes other than  the edema at L1  indicating acuity of the fracture.  Based on clinical exam findings and imaging studies we do believe that surgery is warranted at this time in order to decrease pain and loss of quality of life.  The surgical intervention that we would recommend would be  The risks of surgery were discussed today with the patient, below are the surgical risks we did discuss.  I thoroughly discussed the risks associated with kyphoplasty including the risk of infection and bleeding the risk of extrusion of cement anteriorly which can lead to death or posteriorly which can lead to paralysis. I discussed with the patient the risk of adjacent level fractures, and that this procedure will not alleviate all of their back pain but should provide some of the back pain by giving stability to the fracture. The patient expressed complete understanding of the stated above and has elected to proceed with the recommendation of kyphoplasty at this time.   Diagnosis: L1 compression fracture    Post-operative care plans were discussed with the patient today and all patient questions were answered. Therapy Plans:  Disposition:  Planned DVT Prophylaxis:   PCP: Alanna Hu, MD TXA: No Allergies:  LATEX- blisters; Codine--Rash and dizziness  Anesthesia Concerns: none  BMI: 32.4  Instructed patient on which medications to discontinue 5 days prior to surgery. Will follow-up in office with Dr. Vaughn Georges in 1-2 weeks post-op.

## 2024-01-10 ENCOUNTER — Encounter (HOSPITAL_COMMUNITY): Payer: Self-pay | Admitting: Orthopedic Surgery

## 2024-01-10 ENCOUNTER — Ambulatory Visit (HOSPITAL_COMMUNITY): Admitting: Certified Registered Nurse Anesthetist

## 2024-01-10 ENCOUNTER — Encounter (HOSPITAL_COMMUNITY)
Admission: RE | Disposition: A | Discharge status: Home / Self Care | Payer: Self-pay | Source: Home / Self Care | Attending: Orthopedic Surgery

## 2024-01-10 ENCOUNTER — Other Ambulatory Visit: Payer: Self-pay

## 2024-01-10 ENCOUNTER — Ambulatory Visit (HOSPITAL_COMMUNITY)

## 2024-01-10 ENCOUNTER — Ambulatory Visit (HOSPITAL_COMMUNITY)
Admission: RE | Admit: 2024-01-10 | Discharge: 2024-01-10 | Disposition: A | Discharge status: Home / Self Care | Attending: Orthopedic Surgery | Admitting: Orthopedic Surgery

## 2024-01-10 DIAGNOSIS — E039 Hypothyroidism, unspecified: Secondary | ICD-10-CM | POA: Diagnosis not present

## 2024-01-10 DIAGNOSIS — K219 Gastro-esophageal reflux disease without esophagitis: Secondary | ICD-10-CM | POA: Insufficient documentation

## 2024-01-10 DIAGNOSIS — S32040A Wedge compression fracture of fourth lumbar vertebra, initial encounter for closed fracture: Secondary | ICD-10-CM

## 2024-01-10 DIAGNOSIS — F0394 Unspecified dementia, unspecified severity, with anxiety: Secondary | ICD-10-CM | POA: Insufficient documentation

## 2024-01-10 DIAGNOSIS — M8008XA Age-related osteoporosis with current pathological fracture, vertebra(e), initial encounter for fracture: Secondary | ICD-10-CM | POA: Diagnosis not present

## 2024-01-10 DIAGNOSIS — F418 Other specified anxiety disorders: Secondary | ICD-10-CM | POA: Diagnosis not present

## 2024-01-10 DIAGNOSIS — F0393 Unspecified dementia, unspecified severity, with mood disturbance: Secondary | ICD-10-CM | POA: Insufficient documentation

## 2024-01-10 DIAGNOSIS — F319 Bipolar disorder, unspecified: Secondary | ICD-10-CM | POA: Diagnosis not present

## 2024-01-10 HISTORY — PX: KYPHOPLASTY: SHX5884

## 2024-01-10 SURGERY — KYPHOPLASTY
Anesthesia: Monitor Anesthesia Care

## 2024-01-10 MED ORDER — MIDAZOLAM HCL 2 MG/2ML IJ SOLN
INTRAMUSCULAR | Status: AC
  Filled 2024-01-10: qty 2

## 2024-01-10 MED ORDER — BUPIVACAINE-EPINEPHRINE (PF) 0.25% -1:200000 IJ SOLN
INTRAMUSCULAR | Status: AC
  Filled 2024-01-10: qty 30

## 2024-01-10 MED ORDER — OXYCODONE HCL 5 MG PO TABS: 5.0000 mg | ORAL_TABLET | Freq: Once | ORAL | Status: AC | PRN

## 2024-01-10 MED ORDER — METOCLOPRAMIDE HCL 5 MG/ML IJ SOLN
INTRAMUSCULAR | Status: DC | PRN
  Administered 2024-01-10: 10 mg via INTRAVENOUS

## 2024-01-10 MED ORDER — HYDROMORPHONE HCL 1 MG/ML IJ SOLN
0.2500 mg | INTRAMUSCULAR | Status: DC | PRN
  Administered 2024-01-10 (×2): 0.5 mg via INTRAVENOUS

## 2024-01-10 MED ORDER — KETAMINE HCL 10 MG/ML IJ SOLN
INTRAMUSCULAR | Status: DC | PRN
Start: 2024-01-10 — End: 2024-01-10
  Administered 2024-01-10 (×3): 10 mg via INTRAVENOUS

## 2024-01-10 MED ORDER — IOPAMIDOL (ISOVUE-300) INJECTION 61%
INTRAVENOUS | Status: DC | PRN
  Administered 2024-01-10: 30 mL

## 2024-01-10 MED ORDER — KETAMINE HCL 50 MG/5ML IJ SOSY
PREFILLED_SYRINGE | INTRAMUSCULAR | Status: AC
  Filled 2024-01-10: qty 5

## 2024-01-10 MED ORDER — BUPIVACAINE LIPOSOME 1.3 % IJ SUSP
INTRAMUSCULAR | Status: DC | PRN
  Administered 2024-01-10: 16 mL

## 2024-01-10 MED ORDER — ONDANSETRON HCL 4 MG PO TABS: 4.0000 mg | ORAL_TABLET | Freq: Three times a day (TID) | ORAL | 0 refills | Status: AC | PRN

## 2024-01-10 MED ORDER — METOCLOPRAMIDE HCL 5 MG/ML IJ SOLN
INTRAMUSCULAR | Status: AC
  Filled 2024-01-10: qty 2

## 2024-01-10 MED ORDER — ONDANSETRON HCL 4 MG/2ML IJ SOLN
INTRAMUSCULAR | Status: AC
  Filled 2024-01-10: qty 2

## 2024-01-10 MED ORDER — SODIUM CHLORIDE 0.9 % IV SOLN: 12.5000 mg | INTRAVENOUS | Status: DC | PRN

## 2024-01-10 MED ORDER — OXYCODONE HCL 5 MG/5ML PO SOLN: 5.0000 mg | Freq: Once | ORAL | Status: AC | PRN

## 2024-01-10 MED ORDER — ONDANSETRON HCL 4 MG/2ML IJ SOLN
INTRAMUSCULAR | Status: DC | PRN
  Administered 2024-01-10: 4 mg via INTRAVENOUS

## 2024-01-10 MED ORDER — TRAMADOL HCL 50 MG PO TABS: 50.0000 mg | ORAL_TABLET | Freq: Three times a day (TID) | ORAL | Status: AC | PRN

## 2024-01-10 MED ORDER — TRANEXAMIC ACID-NACL 1000-0.7 MG/100ML-% IV SOLN
INTRAVENOUS | Status: AC
  Filled 2024-01-10: qty 100

## 2024-01-10 MED ORDER — CHLORHEXIDINE GLUCONATE 0.12 % MT SOLN
OROMUCOSAL | Status: AC
  Filled 2024-01-10: qty 15

## 2024-01-10 MED ORDER — HYDROMORPHONE HCL 1 MG/ML IJ SOLN
INTRAMUSCULAR | Status: AC
  Filled 2024-01-10: qty 1

## 2024-01-10 MED ORDER — MIDAZOLAM HCL 2 MG/2ML IJ SOLN
INTRAMUSCULAR | Status: DC | PRN
  Administered 2024-01-10: 1 mg via INTRAVENOUS

## 2024-01-10 MED ORDER — BUPIVACAINE-EPINEPHRINE 0.25% -1:200000 IJ SOLN
INTRAMUSCULAR | Status: DC | PRN
  Administered 2024-01-10: 26 mL

## 2024-01-10 MED ORDER — CEFAZOLIN SODIUM-DEXTROSE 2-4 GM/100ML-% IV SOLN
INTRAVENOUS | Status: AC
  Filled 2024-01-10: qty 100

## 2024-01-10 MED ORDER — 0.9 % SODIUM CHLORIDE (POUR BTL) OPTIME
TOPICAL | Status: DC | PRN
  Administered 2024-01-10: 1000 mL

## 2024-01-10 MED ORDER — PROPOFOL 500 MG/50ML IV EMUL
INTRAVENOUS | Status: DC | PRN
  Administered 2024-01-10: 75 ug/kg/min via INTRAVENOUS

## 2024-01-10 MED ORDER — OXYCODONE HCL 5 MG PO TABS
ORAL_TABLET | ORAL | Status: AC
  Filled 2024-01-10: qty 1

## 2024-01-10 MED ORDER — TRANEXAMIC ACID-NACL 1000-0.7 MG/100ML-% IV SOLN
1000.0000 mg | INTRAVENOUS | Status: AC
  Administered 2024-01-10: 1000 mg via INTRAVENOUS

## 2024-01-10 MED ORDER — CEFAZOLIN SODIUM-DEXTROSE 2-4 GM/100ML-% IV SOLN: 2.0000 g | INTRAVENOUS | Status: AC

## 2024-01-10 MED ORDER — LACTATED RINGERS IV SOLN: INTRAVENOUS | Status: DC | PRN

## 2024-01-10 MED ORDER — BUPIVACAINE LIPOSOME 1.3 % IJ SUSP
INTRAMUSCULAR | Status: AC
  Filled 2024-01-10: qty 20

## 2024-01-10 SURGICAL SUPPLY — 38 items
BAG COUNTER SPONGE SURGICOUNT (BAG) IMPLANT
BLADE SURG 15 STRL LF DISP TIS (BLADE) ×1 IMPLANT
BNDG ADH 1X3 SHEER STRL LF (GAUZE/BANDAGES/DRESSINGS) ×2 IMPLANT
CEMENT KYPHON CX01A KIT/MIXER (Cement) ×1 IMPLANT
COVER MAYO STAND STRL (DRAPES) ×1 IMPLANT
COVER SURGICAL LIGHT HANDLE (MISCELLANEOUS) ×1 IMPLANT
CURETTE EXPRESS SZ2 7MM (INSTRUMENTS) IMPLANT
CURETTE WEDGE 8.5MM KYPHX (MISCELLANEOUS) IMPLANT
DERMABOND ADVANCED .7 DNX12 (GAUZE/BANDAGES/DRESSINGS) ×1 IMPLANT
DRAPE C-ARM 42X72 X-RAY (DRAPES) ×2 IMPLANT
DRAPE INCISE IOBAN 66X45 STRL (DRAPES) ×1 IMPLANT
DRAPE LAPAROTOMY T 102X78X121 (DRAPES) ×1 IMPLANT
DRAPE WARM FLUID 44X44 (DRAPES) ×1 IMPLANT
DRSG OPSITE POSTOP 3X4 (GAUZE/BANDAGES/DRESSINGS) IMPLANT
DURAPREP 26ML APPLICATOR (WOUND CARE) ×1 IMPLANT
GLOVE BIO SURGEON STRL SZ 6.5 (GLOVE) ×1 IMPLANT
GLOVE BIOGEL PI IND STRL 6.5 (GLOVE) ×1 IMPLANT
GLOVE BIOGEL PI IND STRL 8.5 (GLOVE) IMPLANT
GLOVE SS BIOGEL STRL SZ 8.5 (GLOVE) ×1 IMPLANT
GOWN STRL REUS W/ TWL LRG LVL3 (GOWN DISPOSABLE) ×2 IMPLANT
GOWN STRL REUS W/TWL 2XL LVL3 (GOWN DISPOSABLE) ×1 IMPLANT
INTRODUCER DEVICE OSTEO LEVEL (INTRODUCER) IMPLANT
KIT BASIN OR (CUSTOM PROCEDURE TRAY) ×1 IMPLANT
KIT TURNOVER KIT B (KITS) ×1 IMPLANT
NDL HYPO 22X1.5 SAFETY MO (MISCELLANEOUS) ×1 IMPLANT
NDL SPNL 22GX3.5 QUINCKE BK (NEEDLE) ×1 IMPLANT
NEEDLE HYPO 22X1.5 SAFETY MO (MISCELLANEOUS) ×1 IMPLANT
NEEDLE SPNL 22GX3.5 QUINCKE BK (NEEDLE) ×1 IMPLANT
NS IRRIG 1000ML POUR BTL (IV SOLUTION) ×1 IMPLANT
PACK SRG BSC III STRL LF ECLPS (CUSTOM PROCEDURE TRAY) ×1 IMPLANT
PAD ARMBOARD POSITIONER FOAM (MISCELLANEOUS) ×2 IMPLANT
SPONGE T-LAP 4X18 ~~LOC~~+RFID (SPONGE) ×1 IMPLANT
SUT MNCRL AB 3-0 PS2 18 (SUTURE) ×1 IMPLANT
SYR CONTROL 10ML LL (SYRINGE) ×1 IMPLANT
TOWEL GREEN STERILE (TOWEL DISPOSABLE) ×1 IMPLANT
TRAY KYPHOPAK 15/3 ONESTEP 1ST (MISCELLANEOUS) IMPLANT
TRAY KYPHOPAK 20/3 ONESTEP 1ST (MISCELLANEOUS) IMPLANT
WATER STERILE IRR 1000ML POUR (IV SOLUTION) ×1 IMPLANT

## 2024-01-10 NOTE — H&P (Signed)
 History:  Kaylee Boyer is a very pleasant 53 year old with progressive debilitating back pain for the last 3 months. Imaging demonstrated an L1 compression fracture. she presents today to discuss surgical intervention. As result of the progressive nature of her pain and loss in quality of life we will move forward with a L1 kyphoplasty.  Past Medical History:  Diagnosis Date   Anxiety    Bipolar disease, chronic (HCC)    Dementia (HCC)    depressive dementia   Hypothyroidism    Major depression    Thyroid disease     Allergies  Allergen Reactions   Other Other (See Comments)    Band aids - Blisters   Codeine Rash and Other (See Comments)    Dizziness/nausea    No current facility-administered medications on file prior to encounter.   Current Outpatient Medications on File Prior to Encounter  Medication Sig Dispense Refill   ALPRAZolam  (XANAX  XR) 1 MG 24 hr tablet Take 1 mg by mouth in the morning.     ALPRAZolam  (XANAX ) 1 MG tablet Take 2 mg by mouth at bedtime.     ARIPiprazole (ABILIFY) 2 MG tablet Take 2 mg by mouth at bedtime.     Dextromethorphan-buPROPion ER (AUVELITY) 45-105 MG TBCR Take 1 tablet by mouth in the morning and at bedtime.     doxepin (SINEQUAN) 100 MG capsule Take 100 mg by mouth at bedtime.     levothyroxine  (SYNTHROID ) 112 MCG tablet Take 112 mcg by mouth daily before breakfast.     venlafaxine XR (EFFEXOR-XR) 150 MG 24 hr capsule Take 300 mg by mouth in the morning.      Physical Exam: Vitals:   01/10/24 0618  BP: 108/63  Pulse: 75  Resp: 17  Temp: 97.8 F (36.6 C)  SpO2: 95%   Body mass index is 32.96 kg/m. Clinical exam: Kaylee Boyer is a pleasant individual, who appears younger than their stated age.  She is alert and orientated 3.  No shortness of breath, chest pain.  Abdomen is soft and non-tender, negative loss of bowel and bladder control, no rebound tenderness.  Negative: skin lesions abrasions contusions  Peripheral pulses:  2+ peripheral pulses bilaterally upper extremity.. LE compartments are: Soft and nontender.  Gait pattern: Normal  Assistive devices: None  Neuro: 5/5 motor strength in the lower extremity bilaterally. Negative Babinski test, no clonus, negative straight leg raise test. Symmetrical 2+ deep tendon reflexes. Normal sensation light touch throughout all lower extremity.  Musculoskeletal: Significant mid lumbar pain with palpation. No ecchymosis or bruising. Pain is localized to this area radiates into the paraspinal region. No SI joint pain.  Imaging: X-rays of the lumbar spine completed in March 2025 and repeated today demonstrate the L1 compression fracture. No significant change is noted. No new fracture is seen on the x-rays today.  Lumbar MRI:  Acute L1 compression fracture with approximately 45% anterior loss of height no retropulsion. Minimal disc herniation without neural compression L3-4 L4-5. No significant stenosis. No ligamentous injury. No abnormal marrow signal changes other than the edema at L1 indicating acuity of the fracture.  A/P:  Kaylee Boyer is a very pleasant 54 year old woman has had progressive debilitating pain since about February 2025. The patient was diagnosed with pneumonia and was coughing and felt significant upper lumbar pain. She ultimately presented and had an x-ray which demonstrated an L1 compression fracture. Patient subsequently had an MRI which confirmed the fracture. Repeat imaging today demonstrates the fracture is not progressed.  Patient has a pathological fracture consistent with osteopenia/osteoporosis. It is unknown whether or not there is a pre-existing issue causing the osteopenia/osteoporosis. Patient does have an endocrinologist for hypothyroidism and so I encouraged her to reach out to that physician to set up a metabolic bone workup. From the standpoint of her significant back pain I have recommended we move forward with a kyphoplasty. She has been  wearing a brace and her pain is gotten progressively worse over these last 3 months. Imaging clearly shows that this is an acute/subacute fracture. I have gone over the procedure in great detail and I provided her and her husband with literature. We will plan on an L1 kyphoplasty which can be done under IV sedation at our surgical center. Patient will be discharged to home day of procedure.    Risks of kyphoplasty include: Infection, bleeding, nerve damage, death, stroke, paralysis. Leak of cement ongoing or worse pain, need for additional surgery including other fracture levels. All of their questions were encouraged and addressed we will move forward with surgery in a timely fashion.

## 2024-01-10 NOTE — Anesthesia Preprocedure Evaluation (Signed)
 Anesthesia Evaluation  Patient identified by MRN, date of birth, ID band Patient awake    Reviewed: Allergy & Precautions, H&P , NPO status , Patient's Chart, lab work & pertinent test results, reviewed documented beta blocker date and time   Airway Mallampati: II  TM Distance: >3 FB Neck ROM: full    Dental no notable dental hx.    Pulmonary neg pulmonary ROS   Pulmonary exam normal breath sounds clear to auscultation       Cardiovascular Exercise Tolerance: Good negative cardio ROS  Rhythm:regular Rate:Normal     Neuro/Psych  PSYCHIATRIC DISORDERS Anxiety Depression Bipolar Disorder   negative neurological ROS     GI/Hepatic Neg liver ROS,GERD  Medicated,,  Endo/Other  Hypothyroidism    Renal/GU negative Renal ROS  negative genitourinary   Musculoskeletal   Abdominal  (+) + obese  Peds  Hematology negative hematology ROS (+)   Anesthesia Other Findings   Reproductive/Obstetrics negative OB ROS                             Anesthesia Physical Anesthesia Plan  ASA: 2  Anesthesia Plan: MAC   Post-op Pain Management:    Induction:   PONV Risk Score and Plan: 2 and Propofol  infusion and Treatment may vary due to age or medical condition  Airway Management Planned: Simple Face Mask  Additional Equipment:   Intra-op Plan:   Post-operative Plan:   Informed Consent: I have reviewed the patients History and Physical, chart, labs and discussed the procedure including the risks, benefits and alternatives for the proposed anesthesia with the patient or authorized representative who has indicated his/her understanding and acceptance.     Dental Advisory Given  Plan Discussed with: CRNA  Anesthesia Plan Comments:         Anesthesia Quick Evaluation

## 2024-01-10 NOTE — Transfer of Care (Signed)
 Immediate Anesthesia Transfer of Care Note  Patient: Kaylee Boyer  Procedure(s) Performed: KYPHOPLASTY  Patient Location: PACU  Anesthesia Type:MAC  Level of Consciousness: awake, alert , and oriented  Airway & Oxygen  Therapy: Patient Spontanous Breathing  Post-op Assessment: Report given to RN, Post -op Vital signs reviewed and stable, Patient moving all extremities X 4, and Patient able to stick tongue midline  Post vital signs: Reviewed and stable  Last Vitals:  Vitals Value Taken Time  BP 101/55 01/10/24 08:34  Temp 97.7   Pulse 78 01/10/24 08:35  Resp 15 01/10/24 08:35  SpO2 96 % 01/10/24 08:35  Vitals shown include unfiled device data.  Last Pain:  Vitals:   01/10/24 0631  TempSrc:   PainSc: 6       Patients Stated Pain Goal: 3 (01/10/24 0631)  Complications: No notable events documented.

## 2024-01-10 NOTE — Brief Op Note (Signed)
 01/10/2024  8:29 AM  PATIENT:  Kaylee Boyer  53 y.o. female  PRE-OPERATIVE DIAGNOSIS:  L1 osteoporosis compression fracture  POST-OPERATIVE DIAGNOSIS:  L1 osteoporosis compression fracture  PROCEDURE:  Procedure(s): KYPHOPLASTY (N/A)  SURGEON:  Surgeons and Role:    Mort Ards, MD - Primary  PHYSICIAN ASSISTANT: amber gale  ASSISTANTS:    ANESTHESIA:   local and IV sedation  EBL:  1 mL   BLOOD ADMINISTERED:none  DRAINS: none   LOCAL MEDICATIONS USED:  MARCAINE    and OTHER exparel  SPECIMEN:  No Specimen  DISPOSITION OF SPECIMEN:  N/A  COUNTS:  YES  TOURNIQUET:  * No tourniquets in log *  DICTATION: .Dragon Dictation  PLAN OF CARE: Discharge to home after PACU  PATIENT DISPOSITION:  PACU - hemodynamically stable.

## 2024-01-10 NOTE — Anesthesia Postprocedure Evaluation (Signed)
 Anesthesia Post Note  Patient: Kaylee Boyer  Procedure(s) Performed: KYPHOPLASTY     Patient location during evaluation: PACU Anesthesia Type: MAC Level of consciousness: awake and alert Pain management: pain level controlled Vital Signs Assessment: post-procedure vital signs reviewed and stable Respiratory status: spontaneous breathing, nonlabored ventilation and respiratory function stable Cardiovascular status: blood pressure returned to baseline and stable Postop Assessment: no apparent nausea or vomiting Anesthetic complications: no   No notable events documented.  Last Vitals:  Vitals:   01/10/24 0922 01/10/24 0930  BP: (!) 90/55 (!) 88/59  Pulse: 73 71  Resp: 16 13  Temp:  36.5 C  SpO2: 99% 93%    Last Pain:  Vitals:   01/10/24 0930  TempSrc:   PainSc: 2                  Earvin Goldberg

## 2024-01-10 NOTE — Op Note (Signed)
 OPERATIVE REPORT  DATE OF SURGERY: 01/10/2024  PATIENT NAME:  Kaylee Boyer MRN: [id number] DOB: 1970/12/01  PCP: Orest Bio, MD  PRE-OPERATIVE DIAGNOSIS: Lumbar 1 osteoporotic compression fracture  POST-OPERATIVE DIAGNOSIS: Same  PROCEDURE:   Lumbar 1 kyphoplasty  SURGEON:  Mort Ards, MD  PHYSICIAN ASSISTANT: Letty Raya, PA  ANESTHESIA:   Local MAC  EBL: See anesthesia report   Complications: None  BRIEF HISTORY: EDRIS SCHNECK is a 53 y.o. female who presented to my office with significant mid lumbar pain after a fall.  Imaging demonstrated a osteoporotic compression fracture of L1.  Despite appropriate conservative management quality of life continues to deteriorate.  As a result we elected to move forward with surgery.  All appropriate risks, benefits, alternatives were discussed with the patient consent was obtained.  PROCEDURE DETAILS: Patient was brought into the operating room and was properly positioned on the operating  room table.  After induction with IV sedation the back was prepped and draped in standard fashion.  A timeout was taken to confirm patient procedure and all other important data.  Teds SCDs were also applied by the nurse.   Using fluoroscopy I marked out the lateral borders of the L1 pedicle.  I then anesthetized the skin with quarter percent Marcaine with epinephrine.  Using a spinal needle I then anesthetized the facet complex and paraspinal muscles with a combination of core percent Marcaine with Exparel.  Once local anesthesia had been obtained small stab incision was made on the lateral aspect of the pedicle and the Jamshidi needle was advanced percutaneously to the lateral aspect of the pedicle.  Using AP and lateral fluoroscopy I took an extrapedicular approach to the L1 vertebral body.  As I neared the vertebral body I crossed into the pedicle and into the vertebral body.  Once I had both pedicles cannulated I placed the drill and  then the curette in order to create a void for the balloon.  I then inserted the inflatable bone tamps to create a space.  I then deflated the balloon tamps and inserted the cement.  The cement filled across the midline and spanned underneath the compressed superior endplate of L1.  The cement was then allowed to harden.  A total of approximately 4 cc of cement was placed.  The trocars were then removed and the skin was cleaned.  Additional quarter percent Marcaine with Exparel was then injected into the paraspinal muscles for postoperative analgesia.  A single 3-0 Monocryl stitch simple was placed followed by a honeycomb dressing.  The patient was then transferred to the PACU without incident.  The end of the case all needle sponge counts were correct.  Final x-rays demonstrate satisfactory position of the cement mantle with no leak inferiorly, superiorly, posteriorly, or laterally.   Mort Ards, MD 01/10/2024 8:24 AM

## 2024-01-10 NOTE — Anesthesia Procedure Notes (Signed)
 Procedure Name: MAC Date/Time: 01/10/2024 7:39 AM  Performed by: Alys Julian, CRNAPre-anesthesia Checklist: Patient identified, Emergency Drugs available, Suction available, Patient being monitored and Timeout performed Patient Re-evaluated:Patient Re-evaluated prior to induction Oxygen  Delivery Method: Simple face mask Induction Type: IV induction Placement Confirmation: positive ETCO2 Dental Injury: Teeth and Oropharynx as per pre-operative assessment

## 2024-01-11 ENCOUNTER — Encounter (HOSPITAL_COMMUNITY): Payer: Self-pay | Admitting: Orthopedic Surgery
